# Patient Record
Sex: Female | Born: 1950 | Race: White | Hispanic: No | Marital: Married | Smoking: Never smoker
Health system: Southern US, Community
[De-identification: ages and names within clinical notes are randomized; demographics above are authoritative.]

## PROBLEM LIST (undated history)

## (undated) DIAGNOSIS — F329 Major depressive disorder, single episode, unspecified: Secondary | ICD-10-CM

## (undated) DIAGNOSIS — M1990 Unspecified osteoarthritis, unspecified site: Secondary | ICD-10-CM

## (undated) DIAGNOSIS — I82409 Acute embolism and thrombosis of unspecified deep veins of unspecified lower extremity: Secondary | ICD-10-CM

## (undated) DIAGNOSIS — F32A Depression, unspecified: Secondary | ICD-10-CM

## (undated) DIAGNOSIS — F419 Anxiety disorder, unspecified: Secondary | ICD-10-CM

## (undated) DIAGNOSIS — M419 Scoliosis, unspecified: Secondary | ICD-10-CM

## (undated) DIAGNOSIS — T7840XA Allergy, unspecified, initial encounter: Secondary | ICD-10-CM

## (undated) DIAGNOSIS — M8580 Other specified disorders of bone density and structure, unspecified site: Secondary | ICD-10-CM

## (undated) HISTORY — PX: COLONOSCOPY: SHX174

## (undated) HISTORY — DX: Allergy, unspecified, initial encounter: T78.40XA

## (undated) HISTORY — DX: Major depressive disorder, single episode, unspecified: F32.9

## (undated) HISTORY — DX: Depression, unspecified: F32.A

## (undated) HISTORY — PX: BREAST CYST EXCISION: SHX579

## (undated) HISTORY — DX: Scoliosis, unspecified: M41.9

## (undated) HISTORY — DX: Unspecified osteoarthritis, unspecified site: M19.90

## (undated) HISTORY — DX: Anxiety disorder, unspecified: F41.9

## (undated) HISTORY — DX: Other specified disorders of bone density and structure, unspecified site: M85.80

## (undated) HISTORY — DX: Acute embolism and thrombosis of unspecified deep veins of unspecified lower extremity: I82.409

## (undated) HISTORY — PX: VENA CAVA FILTER PLACEMENT: SUR1032

---

## 1998-08-05 ENCOUNTER — Other Ambulatory Visit: Admission: RE | Admit: 1998-08-05 | Discharge: 1998-08-05 | Payer: Self-pay | Admitting: *Deleted

## 1998-09-13 ENCOUNTER — Ambulatory Visit (HOSPITAL_COMMUNITY): Admission: RE | Admit: 1998-09-13 | Discharge: 1998-09-13 | Payer: Self-pay | Admitting: Obstetrics and Gynecology

## 1999-04-25 HISTORY — PX: CERVICAL DISC SURGERY: SHX588

## 1999-06-29 ENCOUNTER — Encounter: Payer: Self-pay | Admitting: Neurosurgery

## 1999-06-29 ENCOUNTER — Ambulatory Visit (HOSPITAL_COMMUNITY): Admission: RE | Admit: 1999-06-29 | Discharge: 1999-06-29 | Payer: Self-pay | Admitting: Neurosurgery

## 1999-07-21 ENCOUNTER — Encounter: Payer: Self-pay | Admitting: Neurosurgery

## 1999-07-21 ENCOUNTER — Ambulatory Visit (HOSPITAL_COMMUNITY): Admission: RE | Admit: 1999-07-21 | Discharge: 1999-07-21 | Payer: Self-pay

## 1999-08-11 ENCOUNTER — Ambulatory Visit (HOSPITAL_COMMUNITY): Admission: RE | Admit: 1999-08-11 | Discharge: 1999-08-11 | Payer: Self-pay | Admitting: Neurosurgery

## 1999-08-11 ENCOUNTER — Encounter: Payer: Self-pay | Admitting: Neurosurgery

## 1999-11-16 ENCOUNTER — Encounter: Payer: Self-pay | Admitting: Family Medicine

## 1999-11-16 ENCOUNTER — Encounter: Admission: RE | Admit: 1999-11-16 | Discharge: 1999-11-16 | Payer: Self-pay | Admitting: Family Medicine

## 2001-05-20 ENCOUNTER — Other Ambulatory Visit: Admission: RE | Admit: 2001-05-20 | Discharge: 2001-05-20 | Payer: Self-pay | Admitting: Obstetrics and Gynecology

## 2002-10-07 ENCOUNTER — Other Ambulatory Visit: Admission: RE | Admit: 2002-10-07 | Discharge: 2002-10-07 | Payer: Self-pay | Admitting: Obstetrics and Gynecology

## 2003-10-28 ENCOUNTER — Other Ambulatory Visit: Admission: RE | Admit: 2003-10-28 | Discharge: 2003-10-28 | Payer: Self-pay | Admitting: Obstetrics and Gynecology

## 2003-11-24 ENCOUNTER — Encounter (INDEPENDENT_AMBULATORY_CARE_PROVIDER_SITE_OTHER): Payer: Self-pay | Admitting: *Deleted

## 2003-11-24 ENCOUNTER — Ambulatory Visit (HOSPITAL_COMMUNITY): Admission: RE | Admit: 2003-11-24 | Discharge: 2003-11-24 | Payer: Self-pay | Admitting: Internal Medicine

## 2005-02-22 ENCOUNTER — Other Ambulatory Visit: Admission: RE | Admit: 2005-02-22 | Discharge: 2005-02-22 | Payer: Self-pay | Admitting: Obstetrics and Gynecology

## 2006-04-25 ENCOUNTER — Encounter: Admission: RE | Admit: 2006-04-25 | Discharge: 2006-04-25 | Payer: Self-pay | Admitting: Obstetrics and Gynecology

## 2007-01-01 ENCOUNTER — Ambulatory Visit: Payer: Self-pay | Admitting: Oncology

## 2007-01-11 LAB — CBC WITH DIFFERENTIAL/PLATELET
Basophils Absolute: 0 10*3/uL (ref 0.0–0.1)
Eosinophils Absolute: 0.1 10*3/uL (ref 0.0–0.5)
HGB: 13.5 g/dL (ref 11.6–15.9)
MCV: 93.6 fL (ref 81.0–101.0)
MONO#: 0.4 10*3/uL (ref 0.1–0.9)
MONO%: 7.3 % (ref 0.0–13.0)
NEUT#: 2.6 10*3/uL (ref 1.5–6.5)
RBC: 4.04 10*6/uL (ref 3.70–5.32)
RDW: 13.3 % (ref 11.3–14.5)
WBC: 4.9 10*3/uL (ref 3.9–10.0)

## 2007-01-15 LAB — HYPERCOAGULABLE PANEL, COMPREHENSIVE
AntiThromb III Func: 97 % (ref 76–126)
Anticardiolipin IgG: <7 [GPL'U] (ref <11)
Anticardiolipin IgM: <7 [MPL'U] (ref <10)
Beta-2 Glyco I IgG: <4 U/mL (ref <20)
Beta-2-Glycoprotein I IgM: <4 U/mL (ref <10)
Homocysteine: 9.3 umol/L (ref 4.0–15.4)
PTT Lupus Anticoagulant: 39.9 secs (ref 36.3–48.8)
Protein C Activity: 105 % (ref 75–133)
Protein S Ag, Total: 125 % (ref 70–140)

## 2007-01-15 LAB — COMPREHENSIVE METABOLIC PANEL
Albumin: 4 g/dL (ref 3.5–5.2)
BUN: 11 mg/dL (ref 6–23)
CO2: 28 mEq/L (ref 19–32)
Calcium: 9.3 mg/dL (ref 8.4–10.5)
Chloride: 104 mEq/L (ref 96–112)
Glucose, Bld: 82 mg/dL (ref 70–99)
Potassium: 4.1 mEq/L (ref 3.5–5.3)
Sodium: 142 mEq/L (ref 135–145)
Total Protein: 7 g/dL (ref 6.0–8.3)

## 2009-01-29 ENCOUNTER — Encounter: Admission: RE | Admit: 2009-01-29 | Discharge: 2009-01-29 | Payer: Self-pay | Admitting: Neurological Surgery

## 2009-11-02 ENCOUNTER — Encounter: Admission: RE | Admit: 2009-11-02 | Discharge: 2009-11-02 | Payer: Self-pay | Admitting: Family Medicine

## 2009-11-12 ENCOUNTER — Encounter: Admission: RE | Admit: 2009-11-12 | Discharge: 2009-11-12 | Payer: Self-pay | Admitting: Family Medicine

## 2009-11-22 ENCOUNTER — Ambulatory Visit: Payer: Self-pay | Admitting: Oncology

## 2009-11-30 LAB — HYPERCOAGULABLE PANEL, COMPREHENSIVE
Anticardiolipin IgG: 5 GPL U/mL (ref <23)
Anticardiolipin IgM: 2 MPL U/mL (ref <11)
Beta-2-Glycoprotein I IgM: 3 M Units (ref <20)
DRVVT: 38.3 secs (ref 36.2–44.3)
Homocysteine: 8.7 umol/L (ref 4.0–15.4)
Protein C Activity: 128 % (ref 75–133)
Protein C, Total: 114 % (ref 70–140)
Protein S Activity: 76 % (ref 69–129)
Protein S Ag, Total: 105 % (ref 70–140)

## 2010-01-20 ENCOUNTER — Ambulatory Visit: Admission: RE | Admit: 2010-01-20 | Discharge: 2010-01-20 | Payer: Self-pay | Admitting: Oncology

## 2010-01-20 ENCOUNTER — Ambulatory Visit: Payer: Self-pay | Admitting: Vascular Surgery

## 2010-01-20 ENCOUNTER — Encounter: Payer: Self-pay | Admitting: Oncology

## 2010-01-21 ENCOUNTER — Ambulatory Visit: Payer: Self-pay | Admitting: Oncology

## 2010-02-22 ENCOUNTER — Ambulatory Visit: Payer: Self-pay | Admitting: Vascular Surgery

## 2010-04-24 HISTORY — PX: TOTAL KNEE ARTHROPLASTY: SHX125

## 2010-05-09 ENCOUNTER — Ambulatory Visit: Payer: Self-pay | Admitting: Oncology

## 2010-07-05 ENCOUNTER — Ambulatory Visit (HOSPITAL_COMMUNITY)
Admission: RE | Admit: 2010-07-05 | Discharge: 2010-07-05 | Disposition: A | Discharge status: Home / Self Care | Payer: BC Managed Care – PPO | Source: Ambulatory Visit | Attending: Oncology | Admitting: Oncology

## 2010-07-05 DIAGNOSIS — Z7901 Long term (current) use of anticoagulants: Secondary | ICD-10-CM | POA: Insufficient documentation

## 2010-07-05 DIAGNOSIS — I8010 Phlebitis and thrombophlebitis of unspecified femoral vein: Secondary | ICD-10-CM

## 2010-07-05 DIAGNOSIS — I825Y9 Chronic embolism and thrombosis of unspecified deep veins of unspecified proximal lower extremity: Secondary | ICD-10-CM | POA: Insufficient documentation

## 2010-07-05 DIAGNOSIS — Z86718 Personal history of other venous thrombosis and embolism: Secondary | ICD-10-CM

## 2010-07-14 ENCOUNTER — Encounter (HOSPITAL_BASED_OUTPATIENT_CLINIC_OR_DEPARTMENT_OTHER): Payer: BC Managed Care – PPO | Admitting: Oncology

## 2010-07-14 DIAGNOSIS — I824Y9 Acute embolism and thrombosis of unspecified deep veins of unspecified proximal lower extremity: Secondary | ICD-10-CM

## 2010-07-14 DIAGNOSIS — Z7901 Long term (current) use of anticoagulants: Secondary | ICD-10-CM

## 2010-07-14 DIAGNOSIS — D6859 Other primary thrombophilia: Secondary | ICD-10-CM

## 2010-08-25 NOTE — Assessment & Plan Note (Signed)
OFFICE VISIT  Lindsey Wall, Lindsey Wall DOB:  1950/12/02                                       08/24/2010 UEAVW#:[id number]  I discussed patient's case by telephone with Dr. Clelia Croft with Hematology/Oncology, Dr. Thurston Hole, orthopedic, and also with patient.  I had seen her in consultation on 02/22/10 for planning prior to bilateral knee replacements.  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  I had recommended [place] filter placement.  She subsequently saw Dr. Clelia Croft again, and his thinking was to potentially place a retrievable filter before her first knee replacement, remove the filter, and then place another retrievable filter before the second knee replacement, retrieving it afterwards.  His concern was the potential need for lifelong anticoagulation after her filter placement.  I did discuss this with Dr. Clelia Croft.  I explained that I had never heard of the presence of a vena cava filter being an indication for lifelong anticoagulation in the vascular literature.  He states that this was not an absolute indication, but there was some concern in the literature regarding potential emboli from the filter.  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  He felt that she may have an indication for lifelong Coumadin with her Factor V Leiden deficiency and a history of DVT in the past anyway.  After discussing this with Dr. Clelia Croft and Dr. Thurston Hole, we have elected to place a retrieval vena cava filter at the time of her knee replacement with plans of leaving this as a permanent filter with the determination of ongoing anticoagulation per Dr. Clelia Croft.  We will plan her surgery as scheduled on 05/21.    Larina Earthly, M.D. Electronically Signed  TFE/MEDQ  D:  08/24/2010  T:  08/25/2010  Job:  [id number]

## 2010-09-06 NOTE — Consult Note (Signed)
NEW PATIENT CONSULTATION   Lindsey Wall, Lindsey Wall  DOB:  Sep 06, 1950                                       02/22/2010  WGNFA#:[id number]   The patient visits today for evaluation of potential  vena cava filter  replacement prior to the total knee replacement.  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  She is being considered for staged bilateral  knee replacements since she has failed maximum medical therapy.  She  also has a  history of clinical disk surgery.  She has a history of  prior bilateral lower extremity DVTs.  She had undergone a prior  hypercoagulable  workup at the [facility],  Hematology/Oncology by Dr. Clelia Croft.  This revealed a heterozygous factor  V Leiden and otherwise a negative workup..  There is some question as to  whether or not she developed a DVT while on Coumadin and therefore she  has been on once a day  subcutaneous Lovenox injections for 3 months but  the plan continues for a  total of 6 months following the DVT episode in  July.  She does have some mild lower extremity swelling but is really  limited by her knees more than any other factor.  Her  history otherwise  is negative for diabetes or cardiac disease.   SOCIAL HISTORY:  She is married with 2 children.  She does not smoke or  drink alcohol.   FAMILY HISTORY:  Negative premature atherosclerotic disease.   REVIEW OF SYSTEMS:  No weight loss or gain.  She weighs 150 pounds, she  is 5 foot 3 inches tall.  VASCULAR:  Positive for venous clot.  Cardiac, GI, neurologic and pulmonary are negative.  HEMATOLOGIC:  Positive for factor V Leiden deficiency.  GENITOURINARY:  Negative.  ENT is negative.  MUSCULOSKELETAL:  Positive for arthritis and joint pain.  PSYCHIATRIC:  Positive for anxiety.  SKIN:  Negative.   PHYSICAL EXAMINATION:  Well developed, well nourished  white female  appearing her stated age in  no acute distress.  Blood pressure is  123/74, pulse 71,  respirations 14.  HEENT:  Normal.  Chest:  Clear  bilaterally without rales, rhonchi  or wheezes.  Heart:  Regular rate  and rhythm.  Her radial pulses, dorsalis pedis pulse 2+ bilaterally.  Abdomen:  Soft, nontender,  no masses.  Musculoskeletal:  She has no  major deformity.  She does have swelling in both knees.  She has no  cyanosis.  Neurology:  Negative focal neurologic weakness or  paresthesias.  Skin:  Without ulcers or  rashes.   I did review her prior venous duplex done at [facility].   I had a long discussion with this patient explaining that she does not  have any absolute indication for a vena cava filter but she certainly  does have a relative indication.  I explained that she was at  significant increased risk of progressive DVT and pulmonary embolus at  the time of knee surgery.  I discussed both permanent and temporary  retrievable vena cava filters.  I explained that we will be available to  place this at the  time of her knee surgery and this would add very long  time to her surgery.  This could be  done while she was under  general  anesthesia just prior to knee replacement by Dr. Thurston Hole.  We will be  available and would recommend permanent filter placement.  I did explain  the approximately 5% lifelong risk of vena caval occlusion with the  vena caval filter and explained this would usually result after a  massive embolus that would result in severe cardiopulmonary compromise  or death. She understands and wishes to proceed.     Larina Earthly, M.D.  Electronically Signed   TFE/MEDQ  D:  02/22/2010  T:  02/23/2010  Job:  [id number]   cc:   Molly Maduro A. Thurston Hole, M.D.  Blenda Nicely. [employer]

## 2010-09-07 ENCOUNTER — Other Ambulatory Visit (HOSPITAL_COMMUNITY): Payer: Self-pay | Admitting: Orthopedic Surgery

## 2010-09-07 ENCOUNTER — Encounter (HOSPITAL_COMMUNITY)
Admission: RE | Admit: 2010-09-07 | Discharge: 2010-09-07 | Disposition: A | Discharge status: Home / Self Care | Payer: BC Managed Care – PPO | Source: Ambulatory Visit | Attending: Orthopedic Surgery | Admitting: Orthopedic Surgery

## 2010-09-07 DIAGNOSIS — M1990 Unspecified osteoarthritis, unspecified site: Secondary | ICD-10-CM

## 2010-09-07 LAB — URINALYSIS, ROUTINE W REFLEX MICROSCOPIC
Bilirubin Urine: NEGATIVE
Hgb urine dipstick: NEGATIVE
Nitrite: NEGATIVE
Specific Gravity, Urine: 1.008 (ref 1.005–1.030)
Urobilinogen, UA: 0.2 mg/dL (ref 0.0–1.0)

## 2010-09-07 LAB — COMPREHENSIVE METABOLIC PANEL
ALT: 21 U/L (ref 0–35)
Alkaline Phosphatase: 56 U/L (ref 39–117)
CO2: 30 mEq/L (ref 19–32)
Chloride: 105 mEq/L (ref 96–112)
GFR calc Af Amer: >60 mL/min (ref >60)
GFR calc non Af Amer: >60 mL/min (ref >60)
Total Protein: 6.4 g/dL (ref 6.0–8.3)

## 2010-09-07 LAB — PROTIME-INR: INR: 0.94 (ref 0.00–1.49)

## 2010-09-07 LAB — SURGICAL PCR SCREEN
MRSA, PCR: NEGATIVE
Staphylococcus aureus: POSITIVE — AB

## 2010-09-07 LAB — TYPE AND SCREEN: ABO/RH(D): O NEG

## 2010-09-07 LAB — DIFFERENTIAL
Basophils Absolute: 0 10*3/uL (ref 0.0–0.1)
Basophils Relative: 0 % (ref 0–1)
Eosinophils Relative: 1 % (ref 0–5)
Lymphs Abs: 2.6 10*3/uL (ref 0.7–4.0)
Monocytes Relative: 10 % (ref 3–12)
Neutro Abs: 1.7 10*3/uL (ref 1.7–7.7)

## 2010-09-07 LAB — CBC
MCV: 92.9 fL (ref 78.0–100.0)
RDW: 12.8 % (ref 11.5–15.5)
WBC: 4.9 10*3/uL (ref 4.0–10.5)

## 2010-09-07 LAB — ABO/RH: ABO/RH(D): O NEG

## 2010-09-08 LAB — URINE CULTURE
Colony Count: NO GROWTH
Culture  Setup Time: 201205161555
Culture: NO GROWTH

## 2010-09-09 NOTE — Op Note (Signed)
[place]. [facility]  Patient:    Lindsey Wall, Lindsey Wall                          MRN: [id number] Proc. Date: 06/29/99 Adm. Date:  [id number] Disc. Date: [id number] Attending:  Gerald Dexter Dictator:   Reinaldo Meeker, M.D.                           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  PROCEDURE:  C6-7 anterior cervical diskectomy with fibular bone band fusion with operative microscope.  SURGEON:  Dr. Gerlene Fee.  DESCRIPTION OF PROCEDURE:  After being placed in the supine position and 5 pound ______ traction the patients neck was prepped and draped in the usual sterile fashion.  Local x-ray was taken prior to incision to identify the appropriate level.  A transverse incision was made in the right anterior neck, starting at he midline, heading towards the medial aspect of the sternocleidomastoid muscle. he platysmus was then incised transversally.  The natural fascial plane including he strap muscles medially and the sternocleidomastoid laterally was identified and  followed down to the anterior aspect of the cervical spine.  Longus colli muscles were identified, split in the midline, stripped away bilaterally with ______ ______.  Self-retaining retractor was placed for exposure.  Second x-ray showed  appropriate C6-7 level.  Using the 15 blade the annulus of the disc was incised. Using pituitary rongeurs and curettes approximately 90% of the disc material was removed.  A high speed drill was used to widened the disc space.  At this point the microscope was draped, brought into the field, and used throughout the remainder of the case.  Using progressively higher magnification the remainder of the disc material down to the posterior longitudinal ligament was  removed.  This was then removed in a piece fashion.  Disc material was then removed in order to thoroughly decompress the underlying spinal dura.  The deep edges of the C6 and C7 vertebral bodies were then removed as well in order to decompress the underlying spinal dura. Proximal foraminal decompression was carried out as well.  At this point large amounts of irrigation were carried out.  Inspection was then carried out without any evidence of residual compression identified.  Measurements were taken, and mm bone bank plugs were reconstituted.  After irrigated once more and confirming hemostasis the plug was then packed without difficulty.  Final x-ray showed the  plug to be in adequate position, though it was hard to interpret due to the patients body habitus.  This time, irrigation was carried out once more. Bleeding controlled with bipolar coagulation.  The wound was then closed using interrupted Vicryl in the platysmus muscle, inverted 5-0 PDS in the subcuticular layer, and  staples on the skin.  A ______ and Aspen collar were applied.  The patient was extubated and taken to the recovery room in stable condition. DD:  06/29/99 TD:  06/30/99 Job: [id number]

## 2010-09-12 ENCOUNTER — Inpatient Hospital Stay (HOSPITAL_COMMUNITY): Payer: BC Managed Care – PPO

## 2010-09-12 ENCOUNTER — Inpatient Hospital Stay (HOSPITAL_COMMUNITY)
Admission: RE | Admit: 2010-09-12 | Discharge: 2010-09-15 | DRG: 471 | Disposition: A | Discharge status: Rehabilitation Facility | Payer: BC Managed Care – PPO | Source: Ambulatory Visit | Attending: Orthopedic Surgery | Admitting: Orthopedic Surgery

## 2010-09-12 DIAGNOSIS — I824Z9 Acute embolism and thrombosis of unspecified deep veins of unspecified distal lower extremity: Secondary | ICD-10-CM

## 2010-09-12 DIAGNOSIS — Z86718 Personal history of other venous thrombosis and embolism: Secondary | ICD-10-CM

## 2010-09-12 DIAGNOSIS — M5030 Other cervical disc degeneration, unspecified cervical region: Secondary | ICD-10-CM | POA: Diagnosis present

## 2010-09-12 DIAGNOSIS — I2699 Other pulmonary embolism without acute cor pulmonale: Secondary | ICD-10-CM

## 2010-09-12 DIAGNOSIS — D6859 Other primary thrombophilia: Secondary | ICD-10-CM | POA: Diagnosis present

## 2010-09-12 DIAGNOSIS — M1710 Unilateral primary osteoarthritis, unspecified knee: Principal | ICD-10-CM | POA: Diagnosis present

## 2010-09-12 DIAGNOSIS — K5900 Constipation, unspecified: Secondary | ICD-10-CM | POA: Diagnosis not present

## 2010-09-12 DIAGNOSIS — D62 Acute posthemorrhagic anemia: Secondary | ICD-10-CM | POA: Diagnosis not present

## 2010-09-12 DIAGNOSIS — T81718A Complication of other artery following a procedure, not elsewhere classified, initial encounter: Secondary | ICD-10-CM

## 2010-09-12 DIAGNOSIS — G609 Hereditary and idiopathic neuropathy, unspecified: Secondary | ICD-10-CM | POA: Diagnosis present

## 2010-09-13 DIAGNOSIS — Z96659 Presence of unspecified artificial knee joint: Secondary | ICD-10-CM

## 2010-09-13 DIAGNOSIS — M1710 Unilateral primary osteoarthritis, unspecified knee: Secondary | ICD-10-CM

## 2010-09-13 LAB — CBC
MCH: 31.5 pg (ref 26.0–34.0)
MCV: 92.6 fL (ref 78.0–100.0)
RBC: 2.98 MIL/uL — ABNORMAL LOW (ref 3.87–5.11)
RDW: 13 % (ref 11.5–15.5)
WBC: 8.1 10*3/uL (ref 4.0–10.5)

## 2010-09-13 LAB — BASIC METABOLIC PANEL
BUN: 7 mg/dL (ref 6–23)
Calcium: 8.3 mg/dL — ABNORMAL LOW (ref 8.4–10.5)
Creatinine, Ser: 0.5 mg/dL (ref 0.4–1.2)
GFR calc Af Amer: >60 mL/min (ref >60)
GFR calc non Af Amer: >60 mL/min (ref >60)

## 2010-09-13 LAB — HEMOGLOBIN AND HEMATOCRIT, BLOOD: HCT: 26.2 % — ABNORMAL LOW (ref 36.0–46.0)

## 2010-09-13 NOTE — Op Note (Signed)
NAMEFLORENCE, Wall NO.:  [id number]  MEDICAL RECORD NO.:  [id number]           PATIENT TYPE:  I  LOCATION:  5030                         FACILITY:  [facility]  PHYSICIAN:  [name], M.D. DATE OF BIRTH:  May 04, 1950  DATE OF PROCEDURE:  09/12/2010 DATE OF DISCHARGE:                              OPERATIVE REPORT   PREOPERATIVE DIAGNOSES: 1. Left knee degenerative joint disease. 2. Right knee degenerative joint disease.  POSTOPERATIVE DIAGNOSES: 1. Left knee degenerative joint disease. 2. Right knee degenerative joint disease.  PROCEDURE: 1. Left total knee replacement using DePuy cemented total knee system     with #3 cemented femur, #3 cemented tibia with 10-mm polyethylene     RP tibial spacer and 32 mm polyethylene cemented patella. 2. Right total knee replacement using DePuy cemented total knee system     with #3 cemented femur, #3 cemented tibia with 12.5 mm polyethylene     RP tibial spacer and 32 mm polyethylene cemented patella. 3. Zinacef impregnated cement.  SURGEON:  [name], MD  ASSISTANT:  [name], PA-C  ANESTHESIA:  General.  OPERATIVE TIME:  For the left knee 1 hour and 25 minutes, for the right knee 1 hour and 25 minutes.  COMPLICATIONS:  None.  DESCRIPTION OF PROCEDURE:  Ms. Coudriet was brought to the operating room on Sep 12, 2010.  Initially, a left femoral nerve block was placed by Dr. Michelle Piper having anesthesia in the holding room.  A right femoral nerve block was not initially placed because she was to first have a vena cava IVC filter placed by Dr. Arbie Cookey of Vascular surgery due to her history of DVTs and pulmonary emboli.  After being placed under general anesthesia, Dr. Arbie Cookey proceeded with placement of this IVC filter and he will dictate this procedure separately.  After he was done with this procedure which took approximately 20 minutes, both knees were examined under anesthesia.  She had range of motion  from minus 5-125 degrees bilaterally.  Knees were stable to ligamentous exam with mild varus deformity and normal patellar tracking.  She had a Foley catheter placed under sterile conditions.  She received Ancef 2 g IV preoperatively for prophylaxis.  Both legs were prepped using sterile DuraPrep and draped using sterile technique.  Time-out procedure was called and both legs identified as the correct legs.  Initially, the left knee was addressed. Surgically, the left leg was exsanguinated and a thigh tourniquet elevated at 365 mm.  Initially through a 15-cm longitudinal incision based over the patella, initial exposure was made.  The underlying subcutaneous tissues were incised along with skin incision.  A median arthrotomy was performed revealing an excessive amount of normal- appearing joint fluid.  The articular surfaces were inspected.  She had grade 4 changes medially, laterally and in the patellofemoral joints. Osteophytes removed from the femoral condyles and tibial plateau.  The medial and lateral meniscal remnants were removed as well as the anterior cruciate ligament.  Intramedullary drill was then drilled up the femoral canal for placement of distal femoral cutting jig which was placed in  the appropriate manner of rotation and a distal 10-mm cut was made based off the medial side.  At this point, the distal femur was sized.  #3 was found to be appropriate size.  #3 cutting jig was placed in an appropriate manner of external rotation and these cuts were made. Proximal tibia was then exposed and tibial spines were removed with an oscillating saw.  Intramedullary drill was drilled down the tibial canal for placement of the proximal tibial cutting jig which was placed in the appropriate manner of rotation and a proximal 4-mm cut was made based off the medial or lower side.  Spacer blocks were then placed in flexion and extension.  10-mm blocks gave excellent balancing,  excellent stability, and excellent correction of her flexion and varus deformities.  #3 tibial baseplate trial was placed on the cut tibial surface with an excellent fit and a keel cut was made.  The PCL box cutter was placed on the distal femur and these cuts were made.  At this point with a #3 tibial baseplate trial in place, a #3 femoral trial in place and a 10-mm polyethylene RP tibial spacer was placed the knee reduced, taken through range of motion from 0-125 degrees with excellent stability and excellent correction of her flexion and varus deformities and normal patellar tracking.  A resurfacing 8.5-mm cut was made on the patella and three locking holes placed for a 32-mm patellar trial and again patellofemoral tracking was evaluated and found to be normal.  At this point, it was felt that all the trial components were of excellent size, fit and stability.  They were then removed.  The knee was then jet lavage irrigated with 3 liters of saline.  The proximal tibia was then exposed and #3 tibial baseplate with Zinacef impregnated cement backing was hammered into position with an excellent fit with excess cement being removed from around the edges.  #3 femoral component with cement backing was hammered into position also with an excellent fit with excess cement being removed from around the edges.  A 10-mm polyethylene RP tibial spacer was placed on tibial baseplate.  The knee reduced, taken through range of motion from 0-125 degrees with excellent stability and excellent correction of her flexion and varus deformities. A 32-mm polyethylene cement backed patella was then placed in its position and held there with a clamp.  After the cement hardened again patellofemoral tracking was evaluated and found to be normal.  At this point, it was felt that all of the components were of excellent size, fit and stability.  The wound was further irrigated with saline. Tourniquet was released.   Hemostasis obtained with cautery.  The arthrotomy was then closed with #1 Ethibond suture over two medium Hemovac drains.  Subcutaneous tissues closed with 0 and 2-0 Vicryl, subcuticular layer closed with 4-0 Monocryl.  Sterile dressings were applied.  At this point, the patient was doing well from anesthetic standpoint and we proceeded with the right knee.  The right leg was exsanguinated and thigh tourniquet elevated at 365 mm. Initially through a 15-cm longitudinal incision based over the patella, initial exposure was made.  The underlying subcutaneous tissues were incised along with skin incision.  A median arthrotomy was performed revealing an excessive amount of normal-appearing joint fluid.  The articular surfaces were inspected.  She had grade 4 changes medially, laterally and in the patellofemoral joint.  Osteophytes removed from the femoral condyles and tibial plateau.  The medial and lateral meniscal remnants were  removed as well as the anterior cruciate ligament. Intramedullary drill was then drilled up the femoral canal for placement of distal femoral cutting jig which was placed in the appropriate manner of rotation and a distal 10-mm cut was made.  The distal femur was incised.  #3 was found to be the appropriate size.  #3 cutting jig was placed in the appropriate manner of external rotation and then these cuts were made.  The proximal tibia was then exposed.  The tibial spines were removed with an oscillating saw.  Intramedullary drill was drilled down the tibial canal for placement of proximal tibial cutting jig which was placed in appropriate manner of rotation.  A proximal 4-mm cut was made based off the medial or lower side.  Spacer blocks were then placed in flexion and extension.  12.5-mm blocks gave excellent balancing, excellent stability, and excellent correction of her flexion and varus deformities.  A #3 tibial baseplate trial was placed on the cut  tibial surface with an excellent fit and the keel cut was made.  The PCL box cutter was placed on the distal femur and these cuts were made.  At this point, the #3 femoral trial was placed with a #3 tibial baseplate trial and a 12.5-mm polyethylene RP tibial spacer, knee was reduced, taken through range of motion from 0-125 degrees with excellent stability and excellent correction of her flexion and varus deformities and normal patellar tracking.  A resurfacing 8.5-mm cut was made on the patella and three locking holes placed for a 32-mm patellar trial.  After this was placed again patellofemoral tracking was evaluated and found to be normal.  At this point, it was felt that all the trial components were of excellent size, fit and stability.  They were then removed.  The knee was then jet lavage irrigated with 3 liters of saline.  Proximal tibia was then exposed and #3 tibial baseplate with Zinacef impregnated cement backing was hammered into position with an excellent fit with excess cement being removed from around the edges.  #3 femoral component with cement backing was hammered into position also with an excellent fit with excess cement being removed from around the edges.  A 12.5-mm polyethylene RP tibial spacer was placed on tibial baseplate, knee reduced, taken through range of motion from 0-125 degrees with excellent stability and excellent correction of her flexion and varus deformities. The 32-mm polyethylene cement backed patella was then placed in its position and held there with a clamp.  After the cement hardened, again patellofemoral tracking was evaluated and found to be normal.  At this point, it was felt that all the components were of excellent size, fit and stability.  The wound was further irrigated with saline.  Tourniquet was released.  Hemostasis obtained with cautery.  The arthrotomy was then closed with #1 Ethibond suture over two medium Hemovac  drains. Subcutaneous tissues were closed with 0 and 2-0 Vicryl, subcuticular layer closed with 4-0 Monocryl.  Sterile dressings and long-leg splint applied.  The patient then awakened, extubated, and taken to recovery room in stable condition.  Needle and sponge counts correct x2 at the end of the case.  Neurovascular status normal.  Pulses 2+ and symmetric.     Kealie Barrie A. Thurston Hole, M.D.     RAW/MEDQ  D:  09/12/2010  T:  09/12/2010  Job:  [id number]  Electronically Signed by Salvatore Marvel M.D. on 09/13/2010 05:29:33 PM

## 2010-09-14 LAB — BASIC METABOLIC PANEL
Calcium: 8.4 mg/dL (ref 8.4–10.5)
Chloride: 108 mEq/L (ref 96–112)
Creatinine, Ser: <0.47 mg/dL (ref 0.4–1.2)
Sodium: 141 mEq/L (ref 135–145)

## 2010-09-14 LAB — CBC
MCH: 31.2 pg (ref 26.0–34.0)
Platelets: 177 10*3/uL (ref 150–400)
RBC: 2.82 MIL/uL — ABNORMAL LOW (ref 3.87–5.11)
WBC: 8 10*3/uL (ref 4.0–10.5)

## 2010-09-14 LAB — HEMOGLOBIN AND HEMATOCRIT, BLOOD: Hemoglobin: 9 g/dL — ABNORMAL LOW (ref 12.0–15.0)

## 2010-09-15 ENCOUNTER — Inpatient Hospital Stay (HOSPITAL_COMMUNITY)
Admission: RE | Admit: 2010-09-15 | Discharge: 2010-09-22 | DRG: 462 | Disposition: A | Discharge status: Home / Self Care | Payer: BC Managed Care – PPO | Source: Other Acute Inpatient Hospital | Attending: Physical Medicine & Rehabilitation | Admitting: Physical Medicine & Rehabilitation

## 2010-09-15 DIAGNOSIS — M4712 Other spondylosis with myelopathy, cervical region: Secondary | ICD-10-CM

## 2010-09-15 DIAGNOSIS — Z96659 Presence of unspecified artificial knee joint: Secondary | ICD-10-CM

## 2010-09-15 DIAGNOSIS — M1710 Unilateral primary osteoarthritis, unspecified knee: Secondary | ICD-10-CM

## 2010-09-15 DIAGNOSIS — D6859 Other primary thrombophilia: Secondary | ICD-10-CM | POA: Diagnosis present

## 2010-09-15 DIAGNOSIS — Z5189 Encounter for other specified aftercare: Principal | ICD-10-CM

## 2010-09-15 DIAGNOSIS — Z981 Arthrodesis status: Secondary | ICD-10-CM

## 2010-09-15 DIAGNOSIS — Z86718 Personal history of other venous thrombosis and embolism: Secondary | ICD-10-CM

## 2010-09-15 DIAGNOSIS — D62 Acute posthemorrhagic anemia: Secondary | ICD-10-CM | POA: Diagnosis present

## 2010-09-15 DIAGNOSIS — L259 Unspecified contact dermatitis, unspecified cause: Secondary | ICD-10-CM | POA: Diagnosis not present

## 2010-09-15 DIAGNOSIS — I959 Hypotension, unspecified: Secondary | ICD-10-CM | POA: Diagnosis not present

## 2010-09-15 DIAGNOSIS — K5900 Constipation, unspecified: Secondary | ICD-10-CM | POA: Diagnosis present

## 2010-09-15 LAB — CBC
Hemoglobin: 8.4 g/dL — ABNORMAL LOW (ref 12.0–15.0)
MCH: 31.5 pg (ref 26.0–34.0)
MCV: 93.6 fL (ref 78.0–100.0)
RBC: 2.67 MIL/uL — ABNORMAL LOW (ref 3.87–5.11)

## 2010-09-15 LAB — BASIC METABOLIC PANEL
CO2: 30 mEq/L (ref 19–32)
Chloride: 103 mEq/L (ref 96–112)
Creatinine, Ser: 0.5 mg/dL (ref 0.4–1.2)
GFR calc Af Amer: >60 mL/min (ref >60)

## 2010-09-16 DIAGNOSIS — Z96659 Presence of unspecified artificial knee joint: Secondary | ICD-10-CM

## 2010-09-16 DIAGNOSIS — M1710 Unilateral primary osteoarthritis, unspecified knee: Secondary | ICD-10-CM

## 2010-09-16 DIAGNOSIS — M4712 Other spondylosis with myelopathy, cervical region: Secondary | ICD-10-CM

## 2010-09-16 LAB — URINALYSIS, ROUTINE W REFLEX MICROSCOPIC
Nitrite: NEGATIVE
Specific Gravity, Urine: 1.012 (ref 1.005–1.030)
Urobilinogen, UA: 0.2 mg/dL (ref 0.0–1.0)
pH: 8 (ref 5.0–8.0)

## 2010-09-16 LAB — CBC
HCT: 24.8 % — ABNORMAL LOW (ref 36.0–46.0)
Hemoglobin: 8.4 g/dL — ABNORMAL LOW (ref 12.0–15.0)
MCH: 31.7 pg (ref 26.0–34.0)
MCHC: 33.9 g/dL (ref 30.0–36.0)
MCV: 93.6 fL (ref 78.0–100.0)

## 2010-09-16 LAB — COMPREHENSIVE METABOLIC PANEL
BUN: 7 mg/dL (ref 6–23)
CO2: 29 mEq/L (ref 19–32)
Calcium: 8.3 mg/dL — ABNORMAL LOW (ref 8.4–10.5)
Creatinine, Ser: <0.47 mg/dL (ref 0.4–1.2)
Glucose, Bld: 104 mg/dL — ABNORMAL HIGH (ref 70–99)

## 2010-09-16 LAB — DIFFERENTIAL
Basophils Relative: 0 % (ref 0–1)
Lymphocytes Relative: 28 % (ref 12–46)
Monocytes Absolute: 0.7 10*3/uL (ref 0.1–1.0)
Monocytes Relative: 10 % (ref 3–12)
Neutro Abs: 4.7 10*3/uL (ref 1.7–7.7)

## 2010-09-17 LAB — IRON AND TIBC
Iron: 34 ug/dL — ABNORMAL LOW (ref 42–135)
Saturation Ratios: 16 % — ABNORMAL LOW (ref 20–55)
UIBC: 177 ug/dL

## 2010-09-17 LAB — URINE CULTURE: Culture  Setup Time: 201205250927

## 2010-09-19 LAB — CBC
HCT: 23.8 % — ABNORMAL LOW (ref 36.0–46.0)
Hemoglobin: 8.2 g/dL — ABNORMAL LOW (ref 12.0–15.0)
RDW: 13.1 % (ref 11.5–15.5)
WBC: 7 10*3/uL (ref 4.0–10.5)

## 2010-09-20 DIAGNOSIS — Z96659 Presence of unspecified artificial knee joint: Secondary | ICD-10-CM

## 2010-09-20 DIAGNOSIS — M4712 Other spondylosis with myelopathy, cervical region: Secondary | ICD-10-CM

## 2010-09-20 DIAGNOSIS — M1710 Unilateral primary osteoarthritis, unspecified knee: Secondary | ICD-10-CM

## 2010-09-21 NOTE — H&P (Signed)
NAMEVILLA, BURGIN NO.:  [id number]  MEDICAL RECORD NO.:  [id number]           PATIENT TYPE:  I  LOCATION:  4148                         FACILITY:  [facility]  PHYSICIAN:  Erick Colace, M.D.DATE OF BIRTH:  1950/11/08  DATE OF ADMISSION:  09/15/2010 DATE OF DISCHARGE:                             HISTORY & PHYSICAL   REASON FOR ADMISSION:  Bilateral total knee replacement rehabilitation.  HISTORY:  A 60 year old female with prior history of factor V Leiden deficiency with history of bilateral DVTs, end-stage DJD bilateral knees admitted Sep 12, 2010, for IVC filter placement prior to bilateral TKR per Dr. Salvatore Marvel.  Postoperatively we made weightbearing, tolerated CPM, full range of motion.  The patient continues on subcu Lovenox with anticoagulation.  Further changes in anticoagulation therapy to be decided by Hematology/Oncology.  Has had issues with hypotension treated with IV fluids.  Continues to have difficulty with transition of movements.  Left knee 0-40 degrees, right knee is 0-30 degrees limited range of motion secondary to pain.  REVIEW OF SYSTEMS:  RESPIRATORY:  Negative.  GI:  Negative.  GU: Negative.  MUSCULOSKELETAL:  As above.  Review of systems otherwise negative.  PAST HISTORY:  Bilateral lower extremity DVT, heterozygous for factor V Leiden deficiency.  She has undergone a C6-7 ACDF in 2001.  Has had a C3- C6 fusion in the past cord flattening.  Depression, scoliosis also in the past history.  FAMILY HISTORY:  Healthy except daughter does have factor V Leiden deficiency, had history of pulmonary emboli on birth control pills.  SOCIAL HISTORY:  Married.  Lives in 1-level home with 6 steps to enter. No tobacco, EtOH is rare.  Husband works days, daughter has to stay with the patient post discharge.  FUNCTIONAL HISTORY:  Independent prior to admission.  MEDICATIONS: 1. Lexapro 20 mg a day. 2. Neurontin 300 mg two p.o.  t.i.d. 3. Mag oxide 400 mg daily. 4. Baclofen 20 mg t.i.d. 5. Caltrate with D one p.o. b.i.d. 6. Lovenox 100 mg subcu daily at home.  LABORATORY DATA:  H and H Sep 16, 2010, 8.4 and 25, BUN 8, creatinine 0.5.  Sodium 138, potassium 3.7.  PHYSICAL EXAMINATION:  VITAL SIGNS:  Blood pressure 110/59, pulse 91, temperature 99.4. GENERAL:  No acute distress. HEENT:  Eyes anicteric and noninjected.  External ENT normal. NECK:  Supple without adenopathy. RESPIRATORY:  Effort is good.  Lungs are clear. HEART:  Regular rate and rhythm.  No rubs, murmurs or extra sounds. ABDOMEN:  Positive bowel sounds, soft, nontender to palpation. EXTREMITIES:  No clubbing, cyanosis or edema.  No calf tenderness.  She has bilateral anterior knee incisions. PSYCH:  Mood, memory, affect, judgment, orientation are all intact. NEURO:  Motor strength is 5/5 bilateral deltoid, biceps, triceps, grip, 3- bilateral hip flexors, knee extension is not tested secondary to pain.  She has 4 at the ankle dorsiflexors.  Gait shows no evidence of toe drag or knee instability.  She walks very very slowly, widened base support.  Decreased knee flexion with a walker and min assist x1.  POST ADMISSION PHYSICIAN  EVALUATION: 1. Functional deficits secondary to knee osteoarthritis status post     bilateral total knee replacement. 2. The patient admitted to receive collaborative interdisciplinary     care between the physiatrist, rehab nursing staff and therapy team. 3. The patient's level of medical complexity and substantial therapy     needs in context of that medical necessity cannot be provided at a     lesser intensity of care. 4. The patient has experienced substantial functional loss from her     baseline.  Upon functional assessment at the time of preadmission     screening, was pending in terms of her evaluations.  Currently, the     patient is mod assist with bed mobility, max assist at edge of bed     transfers,  min assist 25 feet with ambulation, mod assist to total     for lower body dressing, set up for upper body, total transfers are     mod assist.  Judging by the patient's diagnosis, physical exam and     functional history, the patient has potential for functional     progress which will result in measurable gains while in inpatient     rehab.  These gains will be of substantial and practical use upon     discharge to home in facilitating mobility, self-care and     independence.  Interim changes in medical status since preadmission     screening are detailed in history of present illness. 5. Physiatrist will provide 24-hour management of medical needs as     well as oversight of therapy plan/treatment via guidance as     appropriate regarding interactions of the two. 6. A 24-hour rehab nursing will assess in management of skin, bowel,     bladder and help integrate therapy concepts, techniques, and     education. 7. PT will assess and treat for pre gait training, gait training,     endurance, equipment.  Goals are for a modified independent level     to supervision for mobility. 8. OT will assess and treat for ADLs, safety, endurance, equipment.     Goals will be modified independent level for upper body and min     assist lower body ADLs. 9. Case management and social worker will assess and treat for     psychosocial issues and discharge planning. 10.A team conference will be held weekly to assess the patient's     progress/goals and to determine barriers to discharge. 11.The patient has demonstrated sufficient medical stability and     exercise capacity to tolerate at least 3 hours of therapy per day     at least 5 days per week. 12.Estimated length of stay is 10 days.  Prognosis for further functional improvement is good.  MEDICAL PROBLEM LIST AND PLAN: 1. DVT prophylaxis.  We will continue subcu Lovenox full dose. 2. Pain management schedule fentanyl patch.  Continue  Percocet. 3. Hypotension.  IV fluids discontinued today, push p.o. fluids and     monitor. 4. Cervical myelopathy.  Continue Neurontin, baclofen. 5. Constipation.  Colace changed to MiraLax.  May need to reinstitute     Colace as well.  Motivation and mood appear to be adequate for inpatient rehabilitation for the patient.  Inpatient Rehab Medicine Services discussed with the patient, answered questions, also talked with this with daughter in the room in the patient's presence.     Erick Colace, M.D.     AEK/MEDQ  D:  09/15/2010  T:  09/16/2010  Job:  [id number]  cc:   Benjiman Core, M.D. Robert A. Thurston Hole, M.D. Carola J. Gerri Spore, M.D.  Electronically Signed by Claudette Laws M.D. on 09/21/2010 09:31:12 AM

## 2010-09-29 NOTE — Discharge Summary (Signed)
Lindsey Wall, KATO NO.:  [id number]  MEDICAL RECORD NO.:  [id number]           PATIENT TYPE:  I  LOCATION:  4148                         FACILITY:  [facility]  PHYSICIAN:  Erick Colace, M.D.DATE OF BIRTH:  Oct 26, 1950  DATE OF ADMISSION:  09/15/2010 DATE OF DISCHARGE:  09/22/2010                              DISCHARGE SUMMARY   DISCHARGE DIAGNOSES: 1. Bilateral total knee replacement. 2. Acute blood loss anemia. 3. Factor V Leiden deficiency. 4. Cervical myelopathy.  HISTORY OF PRESENT ILLNESS:  Ms. Lindsey Wall is a 60 year old female with prior history of Factor V Leiden deficiency with bilateral DVTs, end- stage DJD bilateral knees who was admitted Sep 12, 2010, for IVC filter placement prior to bilateral total knee replacement by Dr. Salvatore Marvel.  Postop was made.  Weightbearing as tolerated with CPM use for passive range of motion.  She currently continues on her home dose of Lovenox.  Further changes in anticoagulation to be decided by Hem/Onc past discharge.  The patient has had issues with hypertension treated with IV fluids.  She continues to have difficulty with transitional movements.  Left knee range of motion is at 0-40 degrees, right knee at 0-30 degrees with limited range of motion due to pain issues.  The patient was evaluated by rehab and we felt that she would benefit from CIR program.  PAST MEDICAL HISTORY:  Significant for bilateral lower extremity DVTs, heterozygous Factor V Leiden deficiency, C6-C7 ACDF in 2001, history of C3-C4-C6 gliosis and cord flattening with chronic pain, depressions and lumbar scoliosis.  ALLERGIES:  E-MYCIN and NSAIDS.  REVIEW OF SYMPTOMS:  Positive for pain and constipation issues.  FAMILY HISTORY:  The patient reports immediate family is healthy. Daughter does have Factor V Leiden deficiency.  SOCIAL HISTORY:  The patient is married, lives in 1-level home with 6 steps at entry.  Does not use any  tobacco, uses alcohol rarely.  Husband works days.  However, daughter plans to assist past discharge.  FUNCTIONAL HISTORY:  The patient was independent prior to admission.  FUNCTIONAL STATUS:  The patient is mod assist for bed mobility and transfers, min assist for ambulate 50 feet with rolling walker, setup assist for upper body care, mod-to-total assist for lower body care.  PHYSICAL EXAMINATION:  VITALS:  Blood pressure 110/59, pulse 91, temperature 99.4. GENERAL:  The patient is well-nourished, well-developed female in no acute distress. HEENT:  Eyes anicteric, noninjected.  Oral mucosa is pink and moist. Nares patent.  Teeth in good repair. LUNGS:  Clear to auscultation bilaterally without wheezes, rales or rhonchi.  Respiratory effort is good. HEART:  Regular rate and rhythm without murmurs, gallops or rubs. ABDOMEN:  Soft, nontender with positive bowel sounds. EXTREMITIES:  No clubbing, cyanosis or edema except minimal edema at bilateral knee incisions.  No calf tenderness. NEUROLOGIC:  The patient is alert and oriented x3.  Motor strength is 5/5 bilateral deltoids, biceps, triceps grip, 3- bilateral hip flexions, knee extensions not tested due to pain.  She has 4/5 strength at ankle plantar dorsiflexion.  Gait without evidence of toe drag or knee  instability.  She tends to walk slowly with wide base of support.  HOSPITAL COURSE:  Ms. Lindsey Wall was admitted to rehab on Sep 15, 2010, for inpatient therapies to consist of PT/OT at least 3 hours 5 days a week.  Past admission physiatrist rehab RN and therapy team have worked together to provide customized collaborative interdisciplinary care. Rehab RN has worked with the patient on bowel and bladder program as well as pain management issues.  The patient's knee incision had been monitored long and these are noted to be healing well without any signs or symptoms of infection.  The patient's blood pressures have been checked on  b.i.d. basis and these are ranging from high 90s-120s systolic, 50s-60s diastolic.  Heart rate stable in 60s-70s range.  Labs were done on past admission for followup showing the patient with acute blood loss anemia with H and H at 8.4 and 25.0.  Recheck labs of Sep 19, 2010 shows hemoglobin 8.2, hematocrit 23.8, white count 7.0, platelets 304.  Iron studies done reveal low iron at 34, TIBC at 211, percent saturation at 16.  The patient was started on iron supplement, however, she was unable to tolerate this due to nausea, therefore this was discontinued.  A UA/UC was done past admission and this was negative. Check of lytes past admission revealed sodium 139, potassium 3.8, chloride 105, CO2 29, BUN 7, creatinine 0.47, glucose 104.  LFTs showed low protein stores with albumin at 2.5, total protein 5.4, calcium at 8.3.  The patient was maintained on subcu Lovenox throughout her stay. She was noted to have poor pain management at time of admission and fentanyl patch 12 at mcg was initiated at admission.  This was increased to 25 mcg per day with improvement in her pain symptomatology.  The patient to be discontinued on this with slow taper over the next 2 weeks.  The patient has also had issues with constipation.  She was started on bowel program with addition of Senokot and MiraLax.  She is advised to continue on these meds while on narcotics.  During the patient's stay in rehab, weekly team conference was held to monitor the patient's progress, set goals as well as discuss barriers to discharge.  At time of admission, the patient was limited by decreased endurance, bilateral lower extremity weakness as well as pain control issues.  She required steady to min assist for most of her ADL tasks. OT has worked with the patient on transfers as well as worked on teaching the patient how to compensate and strategies to correct body mechanics for toilet transfers.  Currently, the patient has  advanced to being at modified independent level for all bathing and dressing tasks.  She does not require further OT  past discharge.  Physical Therapy has worked with the patient on mobility.  At admission, the patient was mod assist for sit to stand transfers, min assist for ambulating greater than 150 feet with a rolling walker.  Physical Therapy has been working with the patient's mobility as well as range of motion of bilateral knees.  Currently, the patient is modified independent for all transfers, modified independent for ambulating greater than 500 feet.  She is able to navigate stairs without difficulty.  Family education has been done with the patient's husband.  The patient has elected on further outpatient physical therapy, which will continue past discharge.  On Sep 22, 2010, the patient is discharged to home.  DISCHARGE MEDICATIONS: 1. Fentanyl patch 12 mcg per  hour change q. 72 h #3 RX. 2. Fentanyl patch 25 mcg per hour change q.72 h #3 RX. Use 25 mcg patch      first.  Once this is used up, taper to 12 mcg of patch. 3. Robaxin 500 mg p.o. q.i.d. use as needed for spasms. 4. Zofran 4 mg p.o. q.8 h p.r.n. nausea. 5. OxyIR 5 mg 1-2 p.o. q.4 h p.r.n. moderate-to-severe pain #90 RX. 6. MiraLax 17 g and 8 ounces p.o. per day. 7. Senokot-S 2 p.o. at lunch and 2 p.o. nightly. 8. Triamcinolone cream topically as needed. 9. Baclofen 10 mg 2 tablets p.o. t.i.d. 10.Os-Cal plus D 2 p.o. b.i.d. 11.Neurontin 600 mg p.o. t.i.d. 12.Lexapro 20 mg per day. 13.Lovenox 100 mg subcu q.a.m. 14.Mag ox 400 mg a day. 15.Super B complex 1 p.o. per day.  Diet: Regular.  Activity level:  Intermittent supervision.  No strenuous activity.  No driving till cleared by MD.  Outpatient physical therapy at [facility].  FOLLOWUP:  The patient to follow up with Dr. Wynn Banker as needed. Follow up with Dr. Thurston Hole on September 26, 2010.  Follow up with Dr. Clelia Croft for routine check and for input  regarding anticoagulation.  Follow up with Dr. Gerri Spore for routine check in two weeks including follow u on ABLA.     Delle Reining, P.A.   ______________________________ Erick Colace, M.D.    PL/MEDQ  D:  09/22/2010  T:  09/22/2010  Job:  [id number]  cc:   Otilio Connors. Gerri Spore, M.D. Benjiman Core, M.D. Robert A. Thurston Hole, M.D.  Electronically Signed by Osvaldo Shipper. on 09/22/2010 04:21:46 PM Electronically Signed by Claudette Laws M.D. on 09/29/2010 11:50:05 AM

## 2010-10-11 NOTE — Op Note (Signed)
  [name], [name] NO.:  [id number]  MEDICAL RECORD NO.:  [id number]           PATIENT TYPE:  I  LOCATION:  5030                         FACILITY:  [facility]  PHYSICIAN:  Larina Earthly, M.D.    DATE OF BIRTH:  February 14, 1951  DATE OF PROCEDURE:  09/12/2010 DATE OF DISCHARGE:                              OPERATIVE REPORT   PREOPERATIVE DIAGNOSIS:  Hypercoagulability with history of deep vein thrombosis for bilateral total knee replacements.  POSTOPERATIVE DIAGNOSIS:  Hypercoagulability with history of deep vein thrombosis for bilateral total knee replacements.  PROCEDURE:  Prophylactic inferior vena cava filter placement.  SURGEON:  Larina Earthly, MD  ASSISTANT:  Nurse.  ANESTHESIA:  General endotracheal.  COMPLICATIONS:  None.  Bilateral total knee replacement by Dr. Thurston Hole will be dictated as a separate note.  PROCEDURE IN DETAIL:  The patient was taken to the operating room and placed in supine position where the area of the right groin was prepped and draped in usual sterile fashion.  Using ultrasound visualization and a single-wall puncture, the right common femoral vein was entered and a guidewire was passed up to the level of the inferior vena cava and  this was confirmed with fluoroscopy.  The eclipse of vena cava filter was brought onto the field and the 7-French long delivery system was positioned at the level of L2.  Hand contrast injection showed that the cable was of normal size and there was a wisp of contrast at the renal veins.  The dilator was removed and the femoral access filter was placed in through the sheath.  The sheath was positioned at the level of the deployment.  The filter was deployed with the distal tip of the filter at mid L2, the filter was deployed normally.  The sheath was removed and pressure was held for hemostasis.  The patient did not have any immediate complication and was then to undergo bilateral total  knee replacement by Dr. Thurston Hole.  The patient will receive KUB x-ray in the recovery room for placement.     Larina Earthly, M.D.     TFE/MEDQ  D:  09/12/2010  T:  09/13/2010  Job:  [id number]  cc:   Benjiman Core, M.D. Robert A. Thurston Hole, M.D.  Electronically Signed by Zriyah Kopplin M.D. on 10/11/2010 01:10:36 PM

## 2010-11-02 NOTE — Discharge Summary (Signed)
Lindsey Wall, OSTROM NO.:  [id number]  MEDICAL RECORD NO.:  [id number]           PATIENT TYPE:  I  LOCATION:  5030                         FACILITY:  [facility]  PHYSICIAN:  [name], M.D. DATE OF BIRTH:  [birth year]  DATE OF ADMISSION:  09/12/2010 DATE OF DISCHARGE:  09/15/2010                        DISCHARGE SUMMARY - REFERRING   ADMITTING DIAGNOSES: 1. Bilateral end-stage degenerative joint disease. 2. Factor V Leiden deficiency. 3. History of multiple deep vein thromboses with one possibly     developing on Coumadin. 4. Chronic Lovenox therapy. 5. Neuropathy. 6. Cervical degenerative disk disease with cervical stenosis.  DISCHARGE DIAGNOSES: 1. Bilateral knee end-stage degenerative joint disease, status post     total knee replacement. 2. Acute blood loss anemia. 3. Factor V Leiden deficiency. 4. Cervical degenerative disk disease with spinal stenosis. 5. Peripheral neuropathy. 6. Constipation.  HISTORY OF PRESENT ILLNESS:  The patient is a 60 year old white female with history of bilateral knee end-stage DJD.  Due to her history of multiple DVTs and factor V Leiden deficiency, she had an IVC filter placed by Dr. Arbie Cookey preoperatively on the day of surgery.  PROCEDURES IN-HOUSE:  Inferior vena cava filter by Dr. Arbie Cookey, bilateral total knee replacement by Dr. Thurston Hole, bilateral femoral nerve block by Anesthesia, and Autovac transfusion.  She tolerated all of these procedures well.  HOSPITAL COURSE:  The patient was admitted postoperatively for pain control, DVT prophylaxis, and physical therapy.  On postop day #1, the patient had difficulty with therapy in the morning due to hypotension, but in the afternoon was very cooperative.  Rehab consult was ordered because of her bilateral total knees and that she was an excellent rehab candidate.  She was given fluid boluses on postop day #1 due to her hypotension and on postop day #2 the patient's  hemoglobin was 8.8.  H and H was ordered in the evening in case she needed blood transfusion. That evening, the patient maintained her hemoglobin at 8.8.  That day, she was able to walk 50 feet.  On postop day #3, the patient is alert and oriented.  Her hemoglobin is 8.4.  She is running a slight low-grade fever at 99.4.  Surgical wounds were well approximated and healing. Blood pressure is stable at 110/59.  Pulse is stable at 91.  Surgical wounds were well approximated.  She has no erythema, no ecchymosis. Moderate effusions bilaterally.  She has 2+ dorsalis pedis pulses.  She is being discharged to Rehab in stable condition.  Weightbearing as tolerated.  She will need her CPM 0-70 degrees for 6 hours a day, increased by 5 degrees a day.  She will need to elevate both heels on yellow foam blocks 2-3 times a day to work on extension.  She will need daily physical therapy, daily occupational therapy and group therapy.  DISCHARGE MEDICATIONS: 1. Oxycodone/APAP 5/325 one to two q.4 h. p.r.n. pain. 2. Baclofen 2 tablets p.o. 3 times a day. 3. Calcium citrate with vitamin D 2 tablets daily. 4. Gabapentin 300 mg 2 capsules 3 times a day. 5. Lexapro 20 mg 1 tablet daily.  6. Lovenox 100 mg 1 injection daily. 7. Mag oxide 1 tablet daily. 8. Super B-complex 1 tablet daily.  She needs to follow up with Dr. Thurston Hole on September 26, 2010, for stitch removal.  She is being transferred in stable condition, weightbearing as tolerated, on a regular diet to Rehab at [facility].     [name], PA-C   ______________________________ [name], M.D.    KS/MEDQ  D:  09/15/2010  T:  09/15/2010  Job:  [id number]  Electronically Signed by [name] P.A. on 10/23/2010 07:09:55 PM Electronically Signed by Salvatore Marvel M.D. on 11/02/2010 11:25:48 AM

## 2010-11-04 ENCOUNTER — Encounter (HOSPITAL_BASED_OUTPATIENT_CLINIC_OR_DEPARTMENT_OTHER): Payer: BC Managed Care – PPO | Admitting: Oncology

## 2010-11-04 DIAGNOSIS — Z7901 Long term (current) use of anticoagulants: Secondary | ICD-10-CM

## 2010-11-04 DIAGNOSIS — D6859 Other primary thrombophilia: Secondary | ICD-10-CM

## 2010-11-04 DIAGNOSIS — I824Y9 Acute embolism and thrombosis of unspecified deep veins of unspecified proximal lower extremity: Secondary | ICD-10-CM

## 2010-11-16 ENCOUNTER — Ambulatory Visit (HOSPITAL_COMMUNITY)
Admission: RE | Admit: 2010-11-16 | Discharge: 2010-11-16 | Disposition: A | Discharge status: Home / Self Care | Payer: BC Managed Care – PPO | Source: Ambulatory Visit | Attending: Oncology | Admitting: Oncology

## 2010-11-16 DIAGNOSIS — Z86718 Personal history of other venous thrombosis and embolism: Secondary | ICD-10-CM | POA: Insufficient documentation

## 2010-11-16 DIAGNOSIS — Z8672 Personal history of thrombophlebitis: Secondary | ICD-10-CM

## 2011-02-03 ENCOUNTER — Encounter: Payer: BC Managed Care – PPO | Admitting: Oncology

## 2011-11-16 ENCOUNTER — Other Ambulatory Visit: Payer: Self-pay | Admitting: Oncology

## 2011-11-17 ENCOUNTER — Telehealth: Payer: Self-pay | Admitting: Oncology

## 2011-11-17 NOTE — Telephone Encounter (Signed)
S/w pt re appt for 9/13.

## 2012-01-05 ENCOUNTER — Ambulatory Visit (HOSPITAL_BASED_OUTPATIENT_CLINIC_OR_DEPARTMENT_OTHER): Payer: BC Managed Care – PPO | Admitting: Oncology

## 2012-01-05 DIAGNOSIS — D6859 Other primary thrombophilia: Secondary | ICD-10-CM

## 2012-01-05 DIAGNOSIS — I8290 Acute embolism and thrombosis of unspecified vein: Secondary | ICD-10-CM

## 2012-01-05 DIAGNOSIS — I82409 Acute embolism and thrombosis of unspecified deep veins of unspecified lower extremity: Secondary | ICD-10-CM

## 2012-01-05 NOTE — Progress Notes (Signed)
Hematology and Oncology Follow Up Visit  Lindsey Wall [id number] Mar 24, 1951 61 y.o. 01/05/2012 3:25 PM   Principle Diagnosis: 61 year old with bilateral deep vein thrombosis diagnosed in July of 2011. She was treated with Coumadin and subsequently with Lovenox. She was diagnosed with heterozygous factor V Leiden mutation.  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  Current therapy: She is currently on aspirin 325 mg daily  Interim History: Lindsey Wall presents today for a followup visit. She is a 61 year old woman with the above history. Since her last visit she has been doing very well without any new complaints. She had not had any new blood clots or phlebitis. She is doing very well with her mobility and her knees are healing very well and have finished physical therapy. She had not had any other thrombotic events including DVT, PE or superficial phlebitis. She had not had any bleeding problems and continue to have her IVC filter in place.  Medications: I have reviewed the patient's current medications. Current outpatient prescriptions:aspirin 325 MG tablet, Take 325 mg by mouth daily., Disp: , Rfl: ;  baclofen (LIORESAL) 10 MG tablet, Take 10 mg by mouth Three times a day. 2 with breakfast, 2 with supper, 3 at bedtime, Disp: , Rfl: ;  escitalopram (LEXAPRO) 20 MG tablet, Take 20 mg by mouth daily., Disp: , Rfl:  gabapentin (NEURONTIN) 300 MG capsule, Take 300 mg by mouth Three times a day. 2 with breakfast, 2 with supper, 3 at bedtime, Disp: , Rfl:   Allergies: No Known Allergies  Past Medical History, Surgical history, Social history, and Family History were reviewed and updated.  Review of Systems: Constitutional:  Negative for fever, chills, night sweats, anorexia, weight loss, pain. Cardiovascular: no chest pain or dyspnea on exertion Respiratory: negative Neurological:  negative Dermatological: negative ENT: negative Skin: Negative. Gastrointestinal: no abdominal pain, change in bowel habits, or black or bloody stools Genito-Urinary: negative Hematological and Lymphatic: negative Breast: negative Musculoskeletal: negative Remaining ROS negative. Physical Exam: There were no vitals taken for this visit. ECOG: 1 General appearance: alert Head: Normocephalic, without obvious abnormality, atraumatic Neck: no adenopathy, no carotid bruit, no JVD, supple, symmetrical, trachea midline and thyroid not enlarged, symmetric, no tenderness/mass/nodules Lymph nodes: Cervical, supraclavicular, and axillary nodes normal. Heart:regular rate and rhythm, S1, S2 normal, no murmur, click, rub or gallop Lung:chest clear, no wheezing, rales, normal symmetric air entry Abdomin: soft, non-tender, without masses or organomegaly EXT:no erythema, induration, or nodules   Lab Results: Lab Results  Component Value Date   WBC 7.0 09/19/2010   HGB 8.2* 09/19/2010   HCT 23.8* 09/19/2010   MCV 93.0 09/19/2010   PLT 304 09/19/2010     Chemistry      Component Value Date/Time   NA 139 09/16/2010 0600   K 3.8 09/16/2010 0600   CL 105 09/16/2010 0600   CO2 29 09/16/2010 0600   BUN 7 09/16/2010 0600   CREATININE <0.47 09/16/2010 0600      Component Value Date/Time   CALCIUM 8.3* 09/16/2010 0600   ALKPHOS 67 09/16/2010 0600   AST 15 09/16/2010 0600   ALT 18 09/16/2010 0600   BILITOT 0.3 09/16/2010 0600      Impression and Plan:  61 year old woman with the following issues:  1. Deep vein thrombosis in the setting of heterozygous factor V Leiden mutation. She has been treated with Lovenox for about a year  for what we presume was a provoked clot from a mobility and osteoarthritis. Since her bilateral knee replacement, she has been fully mobile and have been on aspirin with an IVC filter without any complications. For the time being, I do not recommend any further anticoagulation  unless she develops any new clots. At that time she would require full dose anticoagulation probably lifetime.  2. Bilateral knee replacement. She is doing very well without and fully recovered.  3. Inferior vena cava filter placement based on the patient's wishes the filter will be will be left in place for the time being.  4. followup: In one year or sooner if needed.  [facility], MD 9/13/20133:25 PM

## 2012-01-09 ENCOUNTER — Telehealth: Payer: Self-pay | Admitting: Oncology

## 2012-01-09 NOTE — Telephone Encounter (Signed)
S.W. Pt and made aware of nxt appt on 9.12.14

## 2013-01-01 ENCOUNTER — Telehealth: Payer: Self-pay | Admitting: Oncology

## 2013-01-01 NOTE — Telephone Encounter (Signed)
Pt called and r/s lab Md vsiit to october 2014

## 2013-01-03 ENCOUNTER — Ambulatory Visit: Payer: BC Managed Care – PPO | Admitting: Oncology

## 2013-02-20 ENCOUNTER — Ambulatory Visit (HOSPITAL_BASED_OUTPATIENT_CLINIC_OR_DEPARTMENT_OTHER): Payer: Managed Care, Other (non HMO) | Admitting: Oncology

## 2013-02-20 VITALS — BP 120/73 | HR 57 | Temp 97.7°F | Resp 18 | Wt 159.5 lb

## 2013-02-20 DIAGNOSIS — I82409 Acute embolism and thrombosis of unspecified deep veins of unspecified lower extremity: Secondary | ICD-10-CM

## 2013-02-20 DIAGNOSIS — D6859 Other primary thrombophilia: Secondary | ICD-10-CM

## 2013-02-20 NOTE — Progress Notes (Signed)
Hematology and Oncology Follow Up Visit  Lindsey Wall [id number] 12/11/1950 62 y.o. 02/20/2013 3:34 PM   Principle Diagnosis: 62 year old with bilateral deep vein thrombosis diagnosed in July of 2011. She was treated with Coumadin and subsequently with Lovenox. She was diagnosed with heterozygous factor V Leiden mutation.  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  Current therapy: She is currently on aspirin 325 mg daily  Interim History: Lindsey Wall presents today for a followup visit. Since her last visit she has been doing very well without any new complaints. She had not had any new blood clots or phlebitis. She had not had any other thrombotic events including DVT, PE or superficial phlebitis. She had not had any bleeding problems and continue to have her IVC filter in place. She reported no complications associated with aspirin and had not had any dyspepsia or bleeding. She does report occasional lower show many swelling after an extended period of walking or activity.  Medications: I have reviewed the patient's current medications.  Current Outpatient Prescriptions  Medication Sig Dispense Refill  . aspirin 325 MG tablet Take 325 mg by mouth daily.      . baclofen (LIORESAL) 10 MG tablet Take 10 mg by mouth Three times a day. 2 with breakfast, 2 with supper, 3 at bedtime      . escitalopram (LEXAPRO) 20 MG tablet Take 20 mg by mouth daily.      Marland Kitchen gabapentin (NEURONTIN) 300 MG capsule Take 300 mg by mouth Three times a day. 2 with breakfast, 2 with supper, 3 at bedtime       No current facility-administered medications for this visit.    Allergies: No Known Allergies  Past Medical History, Surgical history, Social history, and Family History were reviewed and updated.  Review of Systems:  Remaining ROS negative. Physical Exam: Blood pressure 120/73, pulse 57, temperature  97.7 F (36.5 C), temperature source Oral, resp. rate 18, weight 159 lb 8 oz (72.349 kg), SpO2 98.00%. ECOG: 1 General appearance: alert Head: Normocephalic, without obvious abnormality, atraumatic Neck: no adenopathy, no carotid bruit, no JVD, supple, symmetrical, trachea midline and thyroid not enlarged, symmetric, no tenderness/mass/nodules Lymph nodes: Cervical, supraclavicular, and axillary nodes normal. Heart:regular rate and rhythm, S1, S2 normal, no murmur, click, rub or gallop Lung:chest clear, no wheezing, rales, normal symmetric air entry Abdomin: soft, non-tender, without masses or organomegaly EXT:no erythema, induration, or nodules   Lab Results: Lab Results  Component Value Date   WBC 7.0 09/19/2010   HGB 8.2* 09/19/2010   HCT 23.8* 09/19/2010   MCV 93.0 09/19/2010   PLT 304 09/19/2010     Chemistry      Component Value Date/Time   NA 139 09/16/2010 0600   K 3.8 09/16/2010 0600   CL 105 09/16/2010 0600   CO2 29 09/16/2010 0600   BUN 7 09/16/2010 0600   CREATININE <0.47 09/16/2010 0600      Component Value Date/Time   CALCIUM 8.3* 09/16/2010 0600   ALKPHOS 67 09/16/2010 0600   AST 15 09/16/2010 0600   ALT 18 09/16/2010 0600   BILITOT 0.3 09/16/2010 0600      Impression and Plan:  62 year old woman with the following issues:  1. Deep vein thrombosis in the setting of heterozygous factor V Leiden mutation. She has been treated with Lovenox for about a year for what we presume was a provoked clot from  a mobility and osteoarthritis. Since her bilateral knee replacement, she has been fully mobile and have been on aspirin with an IVC filter without any complications. For the time being, I do not recommend any further anticoagulation unless she develops any new clots. At that time she would require full dose anticoagulation probably lifetime.  2. Bilateral knee replacement. She is doing very well without and fully recovered.  3. Inferior vena cava filter placement based on the  patient's wishes the filter will be will be left in place for the time being.  4. followup: In one year or sooner if needed.  Eli Hose, MD 10/30/20143:34 PM

## 2013-02-21 ENCOUNTER — Telehealth: Payer: Self-pay | Admitting: Oncology

## 2013-02-21 NOTE — Telephone Encounter (Signed)
lvm for pt for OCT 2015 appt...mailed pt avs and letter

## 2013-11-28 ENCOUNTER — Encounter: Payer: Self-pay | Admitting: Internal Medicine

## 2013-11-28 NOTE — Telephone Encounter (Signed)
Error

## 2014-01-16 ENCOUNTER — Encounter: Payer: Self-pay | Admitting: Family Medicine

## 2014-01-16 ENCOUNTER — Telehealth: Payer: Self-pay | Admitting: Oncology

## 2014-01-16 NOTE — Telephone Encounter (Signed)
S/w pt to advise appt chg due to md on call 10/30. Gave new appt 10/29 and mailed appt calendar.

## 2014-02-06 ENCOUNTER — Telehealth: Payer: Self-pay | Admitting: Oncology

## 2014-02-06 NOTE — Telephone Encounter (Signed)
Close encounter 

## 2014-02-10 ENCOUNTER — Encounter: Payer: Self-pay | Admitting: Internal Medicine

## 2014-02-19 ENCOUNTER — Ambulatory Visit: Payer: Managed Care, Other (non HMO) | Admitting: Oncology

## 2014-02-20 ENCOUNTER — Ambulatory Visit: Payer: Managed Care, Other (non HMO) | Admitting: Oncology

## 2014-02-25 ENCOUNTER — Other Ambulatory Visit: Payer: Self-pay | Admitting: Obstetrics and Gynecology

## 2014-02-27 LAB — CYTOLOGY - PAP

## 2014-03-05 ENCOUNTER — Ambulatory Visit (HOSPITAL_BASED_OUTPATIENT_CLINIC_OR_DEPARTMENT_OTHER): Payer: Managed Care, Other (non HMO) | Admitting: Oncology

## 2014-03-05 ENCOUNTER — Telehealth: Payer: Self-pay | Admitting: Oncology

## 2014-03-05 VITALS — BP 124/75 | HR 59 | Temp 98.1°F | Resp 18 | Ht 64.0 in | Wt 160.1 lb

## 2014-03-05 DIAGNOSIS — I82409 Acute embolism and thrombosis of unspecified deep veins of unspecified lower extremity: Secondary | ICD-10-CM

## 2014-03-05 DIAGNOSIS — D6851 Activated protein C resistance: Secondary | ICD-10-CM

## 2014-03-05 DIAGNOSIS — I824Y9 Acute embolism and thrombosis of unspecified deep veins of unspecified proximal lower extremity: Secondary | ICD-10-CM

## 2014-03-05 NOTE — Telephone Encounter (Signed)
Pt confirmed MD visit per 11/12 POF, gave pt AVS..... KJ °

## 2014-03-05 NOTE — Progress Notes (Signed)
Hematology and Oncology Follow Up Visit  Lindsey Wall [id number] October 09, 1950 63 y.o. 03/05/2014 4:30 PM   Principle Diagnosis: 63 year old with bilateral deep vein thrombosis diagnosed in July of 2011. She was treated with Coumadin and subsequently with Lovenox. She was diagnosed with heterozygous factor V Leiden mutation.  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  Current therapy: She is currently on aspirin 325 mg daily  Interim History: Lindsey Wall presents today for a followup visit. Since her last visit she reports no new complaints. She continues to be on aspirin without any complications. She does report occasional lower extremity swelling and pain but is very limited and manageable.She had not had any new blood clots or phlebitis. She had not had any other thrombotic events including DVT, PE or superficial phlebitis. She had not had any bleeding problems and continue to have her IVC filter in place. She reports no dyspepsia or bleeding. She does not report any headaches or blurry vision or syncope. She does not report any chest pain or difficulty breathing. She does not report any nausea or vomiting or abdominal pain. She does not report any frequency urgency or hesitancy she does not report any skeletal complaints. Rest of her review of systems unremarkable.  Medications: I have reviewed the patient's current medications.  Current Outpatient Prescriptions  Medication Sig Dispense Refill  . amoxicillin (AMOXIL) 500 MG capsule   0  . aspirin 325 MG tablet Take 325 mg by mouth daily.    . baclofen (LIORESAL) 10 MG tablet Take 10 mg by mouth Three times a day. 2 with breakfast, 2 with supper, 3 at bedtime    . baclofen (LIORESAL) 20 MG tablet   5  . escitalopram (LEXAPRO) 20 MG tablet Take 20 mg by mouth daily.    . fluticasone (FLONASE) 50 MCG/ACT nasal spray   0  . gabapentin  (NEURONTIN) 300 MG capsule Take 300 mg by mouth Three times a day. 2 with breakfast, 2 with supper, 3 at bedtime    . gabapentin (NEURONTIN) 600 MG tablet   5  . valACYclovir (VALTREX) 1000 MG tablet   0   No current facility-administered medications for this visit.    Allergies: No Known Allergies  Past Medical History, Surgical history, Social history, and Family History were reviewed and updated.   Physical Exam: Blood pressure 124/75, pulse 59, temperature 98.1 F (36.7 C), temperature source Oral, resp. rate 18, height 5\' 4"  (1.626 m), weight 160 lb 1.6 oz (72.621 kg). ECOG: 1 General appearance: alert Head: Normocephalic, without obvious abnormality, atraumatic Neck: no adenopathy Lymph nodes: Cervical, supraclavicular, and axillary nodes normal. Heart:regular rate and rhythm, S1, S2 normal, no murmur, click, rub or gallop Lung:chest clear, no wheezing, rales, normal symmetric air entry Abdomin: soft, non-tender, without masses or organomegaly EXT:no erythema, induration, or nodules   Lab Results: Lab Results  Component Value Date   WBC 7.0 09/19/2010   HGB 8.2* 09/19/2010   HCT 23.8* 09/19/2010   MCV 93.0 09/19/2010   PLT 304 09/19/2010     Chemistry      Component Value Date/Time   NA 139 09/16/2010 0600   K 3.8 09/16/2010 0600   CL 105 09/16/2010 0600   CO2 29 09/16/2010 0600   BUN 7 09/16/2010 0600   CREATININE <0.47 09/16/2010 0600      Component Value Date/Time   CALCIUM 8.3* 09/16/2010 0600  ALKPHOS 67 09/16/2010 0600   AST 15 09/16/2010 0600   ALT 18 09/16/2010 0600   BILITOT 0.3 09/16/2010 0600      Impression and Plan:  63 year old woman with the following issues:  1. Deep vein thrombosis in the setting of heterozygous factor V Leiden mutation. She has been treated with Lovenox for about a year for what we presume was a provoked clot from a mobility and osteoarthritis. Since her bilateral knee replacement, she has been doing well without any  complications. I have continued to recommend aspirin only for anticoagulation.  2. Bilateral knee replacement. She is doing very well without and fully recovered.  3. Inferior vena cava filter placement based on the patient's wishes the filter will be will be left in place for the time being.  4. Followup: In one year.  Eli HoseSHADAD,FIRAS, MD 11/12/20154:30 PM

## 2014-03-11 ENCOUNTER — Ambulatory Visit (AMBULATORY_SURGERY_CENTER): Payer: Self-pay | Admitting: *Deleted

## 2014-03-11 VITALS — Ht 63.5 in | Wt 160.2 lb

## 2014-03-11 DIAGNOSIS — Z1211 Encounter for screening for malignant neoplasm of colon: Secondary | ICD-10-CM

## 2014-03-11 MED ORDER — MOVIPREP 100 G PO SOLR: 1.0000 | Freq: Once | ORAL | Status: DC

## 2014-03-11 NOTE — Progress Notes (Signed)
No egg or soy allergy. ewm No home 02 use. ewm No problems with past sedation. ewm No blood thinners, no diet pills. ewm

## 2014-04-01 ENCOUNTER — Encounter: Payer: Self-pay | Admitting: Internal Medicine

## 2014-04-01 ENCOUNTER — Ambulatory Visit (AMBULATORY_SURGERY_CENTER): Payer: Managed Care, Other (non HMO) | Admitting: Internal Medicine

## 2014-04-01 VITALS — BP 143/77 | HR 56 | Temp 98.9°F | Resp 21 | Ht 63.5 in | Wt 160.0 lb

## 2014-04-01 DIAGNOSIS — Z1211 Encounter for screening for malignant neoplasm of colon: Secondary | ICD-10-CM

## 2014-04-01 MED ORDER — SODIUM CHLORIDE 0.9 % IV SOLN: 500.0000 mL | INTRAVENOUS | Status: DC

## 2014-04-01 NOTE — Op Note (Signed)
[facility] [address] [place], [id number]   COLONOSCOPY PROCEDURE REPORT  PATIENT: Lindsey Wall, Lindsey Wall  MR#: [id number] BIRTHDATE: [birth year] , 63  yrs. old GENDER: female ENDOSCOPIST: Hart Carwinora M Marialuiza Car, MD REFERRED BY:Dr Shirlean Mylararol Webb PROCEDURE DATE:  04/01/2014 PROCEDURE:   Colonoscopy, screening First Screening Colonoscopy - Avg.  risk and is 50 yrs.  old or older - No.  Prior Negative Screening - Now for repeat screening. 10 or more years since last screening  History of Adenoma - Now for follow-up colonoscopy & has been > or = to 3 yrs.  N/A  Polyps Removed Today? No. ASA CLASS:   Class II INDICATIONS:average risk for colon cancer and last colonoscopy August 2005 was a normal exam except for hypertrophied papillae in the rectum. MEDICATIONS: Monitored anesthesia care and Propofol 200 mg IV  DESCRIPTION OF PROCEDURE:   After the risks benefits and alternatives of the procedure were thoroughly explained, informed consent was obtained.  The digital rectal exam revealed no abnormalities of the rectum.   The LB [id number]-[id number]  endoscope was introduced through the anus and advanced to the cecum, which was identified by both the appendix and ileocecal valve. No adverse events experienced.   The quality of the prep was excellent, using MoviPrep  The instrument was then slowly withdrawn as the colon was fully examined.      COLON FINDINGS: Small internal hemorrhoids were found.   There was mild diverticulosis noted in the descending colon.  Retroflexed views revealed no abnormalities. The time to cecum=8 minutes 53 seconds.  Withdrawal time=6 minutes 00 seconds.  The scope was withdrawn and the procedure completed. COMPLICATIONS: There were no immediate complications.  ENDOSCOPIC IMPRESSION: 1.   Small internal hemorrhoids 2.   Mild diverticulosis was noted in the descending colon  RECOMMENDATIONS: High-fiber diet Recall colonoscopy 10 years  eSigned:  Hart Carwinora M  Shekera Beavers, MD 04/01/2014 1:57 PM   cc:   PATIENT NAME:  Lindsey Wall, Lindsey Wall MR#: [id number]

## 2014-04-01 NOTE — Patient Instructions (Signed)
YOU HAD AN ENDOSCOPIC PROCEDURE TODAY AT THE [facility]: Refer to the procedure report that was given to you for any specific questions about what was found during the examination.  If the procedure report does not answer your questions, please call your gastroenterologist to clarify.  If you requested that your care partner not be given the details of your procedure findings, then the procedure report has been included in a sealed envelope for you to review at your convenience later.  YOU SHOULD EXPECT: Some feelings of bloating in the abdomen. Passage of more gas than usual.  Walking can help get rid of the air that was put into your GI tract during the procedure and reduce the bloating. If you had a lower endoscopy (such as a colonoscopy or flexible sigmoidoscopy) you may notice spotting of blood in your stool or on the toilet paper. If you underwent a bowel prep for your procedure, then you may not have a normal bowel movement for a few days.  DIET: Your first meal following the procedure should be a light meal and then it is ok to progress to your normal diet.  A half-sandwich or bowl of soup is an example of a good first meal.  Heavy or fried foods are harder to digest and may make you feel nauseous or bloated.  Likewise meals heavy in dairy and vegetables can cause extra gas to form and this can also increase the bloating.  Drink plenty of fluids but you should avoid alcoholic beverages for 24 hours.  ACTIVITY: Your care partner should take you home directly after the procedure.  You should plan to take it easy, moving slowly for the rest of the day.  You can resume normal activity the day after the procedure however you should NOT DRIVE or use heavy machinery for 24 hours (because of the sedation medicines used during the test).    SYMPTOMS TO REPORT IMMEDIATELY: A gastroenterologist can be reached at any hour.  During normal business hours, 8:30 AM to 5:00 PM Monday through Friday,  call [phone].  After hours and on weekends, please call the GI answering service at [phone] who will take a message and have the physician on call contact you.   Following lower endoscopy (colonoscopy or flexible sigmoidoscopy):  Excessive amounts of blood in the stool  Significant tenderness or worsening of abdominal pains  Swelling of the abdomen that is new, acute  Fever of 100F or higher  FOLLOW UP: Our staff will call the home number listed on your records the next business day following your procedure to check on you and address any questions or concerns that you may have at that time regarding the information given to you following your procedure. This is a courtesy call and so if there is no answer at the home number and we have not heard from you through the emergency physician on call, we will assume that you have returned to your regular daily activities without incident.  SIGNATURES/CONFIDENTIALITY: You and/or your care partner have signed paperwork which will be entered into your electronic medical record.  These signatures attest to the fact that that the information above on your After Visit Summary has been reviewed and is understood.  Full responsibility of the confidentiality of this discharge information lies with you and/or your care-partner.  Next colonoscopy- 10 years  Please read over handouts about high fiber diets, diverticulosis and high fiber diets  Continue your normal medications

## 2014-04-01 NOTE — Progress Notes (Signed)
Patient awakening,vss,report to rn 

## 2014-04-02 ENCOUNTER — Telehealth: Payer: Self-pay | Admitting: *Deleted

## 2014-04-02 NOTE — Telephone Encounter (Signed)
No answer. Number identifier. Message left to call if questions or concerns. 

## 2014-09-22 ENCOUNTER — Other Ambulatory Visit: Payer: Self-pay | Admitting: Neurological Surgery

## 2014-09-22 DIAGNOSIS — M5416 Radiculopathy, lumbar region: Secondary | ICD-10-CM

## 2014-09-24 ENCOUNTER — Other Ambulatory Visit: Payer: Self-pay | Admitting: Neurological Surgery

## 2014-09-24 ENCOUNTER — Ambulatory Visit
Admission: RE | Admit: 2014-09-24 | Discharge: 2014-09-24 | Disposition: A | Discharge status: Home / Self Care | Payer: Managed Care, Other (non HMO) | Source: Ambulatory Visit | Attending: Neurological Surgery | Admitting: Neurological Surgery

## 2014-09-24 DIAGNOSIS — M5416 Radiculopathy, lumbar region: Secondary | ICD-10-CM

## 2014-10-05 ENCOUNTER — Encounter: Payer: Self-pay | Admitting: *Deleted

## 2015-03-05 ENCOUNTER — Ambulatory Visit (HOSPITAL_BASED_OUTPATIENT_CLINIC_OR_DEPARTMENT_OTHER): Payer: Managed Care, Other (non HMO) | Admitting: Oncology

## 2015-03-05 VITALS — BP 121/69 | HR 61 | Temp 98.1°F | Resp 17 | Ht 63.5 in | Wt 166.3 lb

## 2015-03-05 DIAGNOSIS — D6851 Activated protein C resistance: Secondary | ICD-10-CM | POA: Diagnosis not present

## 2015-03-05 DIAGNOSIS — I82403 Acute embolism and thrombosis of unspecified deep veins of lower extremity, bilateral: Secondary | ICD-10-CM

## 2015-03-05 DIAGNOSIS — I82409 Acute embolism and thrombosis of unspecified deep veins of unspecified lower extremity: Secondary | ICD-10-CM

## 2015-03-05 NOTE — Progress Notes (Signed)
Hematology and Oncology Follow Up Visit  Lindsey RobertsFaye E Wall [id number] 14-Jun-1950 64 y.o. 03/05/2015 4:15 PM   Principle Diagnosis: 64 year old with bilateral deep vein thrombosis diagnosed in July of 2011. She was treated with Coumadin and subsequently with Lovenox. She was diagnosed with heterozygous factor V Leiden mutation.  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  Current therapy: She is currently on aspirin 325 mg daily  Interim History: Lindsey Wall presents today for a followup visit. Since her last visit, she reports complaints of neck pain and back pain. She has severe degenerative arthritis and is in the process of getting cervical spine surgery in the near future.  She continues to be on aspirin without any complications. Has not reported any bleeding or thrombosis. She does report occasional lower extremity swelling and pain but would not change dramatically. she is ambulating without any difficulties. Has not had any falls or syncope.   She had not had any other thrombotic events including DVT, PE or superficial phlebitis. She had not had any bleeding problems and continue to have her IVC filter in place. She reports no dyspepsia or bleeding.   She does not report any headaches or blurry vision or syncope. She does not report any chest pain or difficulty breathing. She does not report any nausea or vomiting or abdominal pain. She does not report any frequency urgency or hesitancy she does not report any skeletal complaints. Rest of her review of systems unremarkable.  Medications: I have reviewed the patient's current medications.  Current Outpatient Prescriptions  Medication Sig Dispense Refill  . amoxicillin (AMOXIL) 500 MG capsule   0  . aspirin 325 MG tablet Take 325 mg by mouth daily.    . baclofen (LIORESAL) 20 MG tablet Take 20 mg by mouth 3 (three) times daily.   5  .  cholecalciferol (VITAMIN D) 1000 UNITS tablet Take 1,000 Units by mouth daily.    Marland Kitchen. escitalopram (LEXAPRO) 20 MG tablet Take 20 mg by mouth daily.    . fluticasone (FLONASE) 50 MCG/ACT nasal spray   0  . gabapentin (NEURONTIN) 300 MG capsule Take 300 mg by mouth Three times a day. 2 with breakfast, 2 with supper, 3 at bedtime    . gabapentin (NEURONTIN) 600 MG tablet   5  . valACYclovir (VALTREX) 1000 MG tablet   0  . vitamin E 400 UNIT capsule Take 400 Units by mouth daily.     No current facility-administered medications for this visit.    Allergies:  Allergies  Allergen Reactions  . Erythromycin Nausea And Vomiting    Past Medical History, Surgical history, Social history, and Family History were reviewed and updated.   Physical Exam: Blood pressure 121/69, pulse 61, temperature 98.1 F (36.7 C), temperature source Oral, resp. rate 17, height 5' 3.5" (1.613 m), weight 166 lb 4.8 oz (75.433 kg), SpO2 100 %. ECOG: 1 General appearance: alert awake woman without distress.  Head: Normocephalic, without obvious abnormality without oral ulcers or lesions.  Neck: no adenopathy Lymph nodes: Cervical, supraclavicular, and axillary nodes normal. Heart:regular rate and rhythm, S1, S2 normal, no murmur, click, rub or gallop Lung:chest clear, no wheezing, rales, normal symmetric air entry Abdomin: soft, non-tender, without masses or organomegaly no shifting dullness or ascites.  EXT:no erythema, induration, or nodules.No edema noted.   Lab Results: Lab Results  Component Value Date   WBC 7.0 09/19/2010  HGB 8.2* 09/19/2010   HCT 23.8* 09/19/2010   MCV 93.0 09/19/2010   PLT 304 09/19/2010     Chemistry      Component Value Date/Time   NA 139 09/16/2010 0600   K 3.8 09/16/2010 0600   CL 105 09/16/2010 0600   CO2 29 09/16/2010 0600   BUN 7 09/16/2010 0600   CREATININE <0.47 09/16/2010 0600      Component Value Date/Time   CALCIUM 8.3* 09/16/2010 0600   ALKPHOS 67 09/16/2010  0600   AST 15 09/16/2010 0600   ALT 18 09/16/2010 0600   BILITOT 0.3 09/16/2010 0600      Impression and Plan:  64 year old woman with the following issues:  1. Deep vein thrombosis in the setting of heterozygous factor V Leiden mutation. She has been treated with Lovenox in the past but have been recently on aspirin. She has not had any recent thrombosis episodes. No reason to change to a different anticoagulation chronically.  2. Cervical spine surgery: No contraindication at this point but I have instructed her to stop aspirin at least 7 days prior to her surgery. In terms of postoperative thrombosis prophylaxis have recommended Lovenox postoperatively until she is fully mobilized. At that time, she can resume aspirin 325 mg daily. If she is anticipated to be immobilized for an extended period of time, I have recommended therapeutic doses of Lovenox to be given at least for 3-4 weeks.  3. Inferior vena cava filter placement: This has been placed previously which will offer protection to prevent pulmonary embolism.  4. Followup: In 6 months to follow-up on her clinical status.   Eli Hose, MD 11/11/20164:15 PM

## 2015-07-08 DIAGNOSIS — M4712 Other spondylosis with myelopathy, cervical region: Secondary | ICD-10-CM | POA: Diagnosis not present

## 2015-12-06 DIAGNOSIS — J069 Acute upper respiratory infection, unspecified: Secondary | ICD-10-CM | POA: Diagnosis not present

## 2015-12-06 DIAGNOSIS — R69 Illness, unspecified: Secondary | ICD-10-CM | POA: Diagnosis not present

## 2015-12-06 DIAGNOSIS — R5383 Other fatigue: Secondary | ICD-10-CM | POA: Diagnosis not present

## 2015-12-15 DIAGNOSIS — Z0000 Encounter for general adult medical examination without abnormal findings: Secondary | ICD-10-CM | POA: Diagnosis not present

## 2015-12-15 DIAGNOSIS — Z23 Encounter for immunization: Secondary | ICD-10-CM | POA: Diagnosis not present

## 2015-12-15 DIAGNOSIS — E7800 Pure hypercholesterolemia, unspecified: Secondary | ICD-10-CM | POA: Diagnosis not present

## 2015-12-15 DIAGNOSIS — E559 Vitamin D deficiency, unspecified: Secondary | ICD-10-CM | POA: Diagnosis not present

## 2015-12-15 DIAGNOSIS — R69 Illness, unspecified: Secondary | ICD-10-CM | POA: Diagnosis not present

## 2016-03-08 ENCOUNTER — Telehealth: Payer: Self-pay | Admitting: *Deleted

## 2016-03-08 DIAGNOSIS — Z124 Encounter for screening for malignant neoplasm of cervix: Secondary | ICD-10-CM | POA: Diagnosis not present

## 2016-03-08 DIAGNOSIS — Z6828 Body mass index (BMI) 28.0-28.9, adult: Secondary | ICD-10-CM | POA: Diagnosis not present

## 2016-03-08 DIAGNOSIS — Z1231 Encounter for screening mammogram for malignant neoplasm of breast: Secondary | ICD-10-CM | POA: Diagnosis not present

## 2016-03-08 NOTE — Telephone Encounter (Signed)
"  I'm due for my annual appointment with Dr. Clelia CroftShadad for blood clots.  Call transferred to scheduler.

## 2016-03-15 ENCOUNTER — Telehealth: Payer: Self-pay | Admitting: Oncology

## 2016-03-15 ENCOUNTER — Ambulatory Visit (HOSPITAL_BASED_OUTPATIENT_CLINIC_OR_DEPARTMENT_OTHER): Payer: Medicare HMO | Admitting: Oncology

## 2016-03-15 VITALS — BP 120/67 | HR 73 | Temp 98.1°F | Resp 18 | Wt 160.7 lb

## 2016-03-15 DIAGNOSIS — Z7982 Long term (current) use of aspirin: Secondary | ICD-10-CM

## 2016-03-15 DIAGNOSIS — Z86718 Personal history of other venous thrombosis and embolism: Secondary | ICD-10-CM | POA: Diagnosis not present

## 2016-03-15 NOTE — Telephone Encounter (Signed)
Appointments scheduled per 03/15/16 los. AVS report and appointment schedule was given to patient, per 03/15/16 los.

## 2016-03-15 NOTE — Progress Notes (Signed)
Hematology and Oncology Follow Up Visit  Lindsey Wall [id number] 1950-05-22 65 y.o. 03/15/2016 3:27 PM   Principle Diagnosis: 65 year old with bilateral deep vein thrombosis diagnosed in July of 2011. She was treated with Coumadin and subsequently with Lovenox. She was diagnosed with heterozygous factor V Leiden mutation.  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  Current therapy: She is currently on aspirin 325 mg daily  Interim History: Lindsey Wall presents today for a followup visit. Since her last visit, she reports no major changes in her health. She had neck surgery done in the last year and tolerated it well without any complications. She denied any thrombosis or bleeding episodes. She continues to be on aspirin without any complications. She does report occasional lower extremity swelling and pain but would not change dramatically. she is ambulating without any difficulties. Has not had any falls or syncope. She does use support stocking which have helped decrease the swelling down.   She does not report any headaches or blurry vision or syncope. She does not report any chest pain or difficulty breathing. She does not report any nausea or vomiting or abdominal pain. She does not report any frequency urgency or hesitancy she does not report any skeletal complaints. Rest of her review of systems unremarkable.  Medications: I have reviewed the patient's current medications.  Current Outpatient Prescriptions  Medication Sig Dispense Refill  . amoxicillin (AMOXIL) 500 MG capsule   0  . aspirin 325 MG tablet Take 325 mg by mouth daily.    . baclofen (LIORESAL) 20 MG tablet Take 20 mg by mouth 3 (three) times daily.   5  . cholecalciferol (VITAMIN D) 1000 UNITS tablet Take 1,000 Units by mouth daily.    Marland Kitchen. escitalopram (LEXAPRO) 20 MG tablet Take 20 mg by mouth daily.    . fluticasone  (FLONASE) 50 MCG/ACT nasal spray   0  . gabapentin (NEURONTIN) 300 MG capsule Take 300 mg by mouth Three times a day. 2 with breakfast, 2 with supper, 3 at bedtime    . gabapentin (NEURONTIN) 600 MG tablet   5  . valACYclovir (VALTREX) 1000 MG tablet   0  . vitamin E 400 UNIT capsule Take 400 Units by mouth daily.     No current facility-administered medications for this visit.     Allergies:  Allergies  Allergen Reactions  . Erythromycin Nausea And Vomiting    Past Medical History, Surgical history, Social history, and Family History were reviewed and updated.   Physical Exam: Blood pressure 120/67, pulse 73, temperature 98.1 F (36.7 C), temperature source Oral, resp. rate 18, weight 160 lb 11.2 oz (72.9 kg), SpO2 98 %. ECOG: 1 General appearance: Well-appearing woman without distress. Head: Normocephalic, without obvious abnormality. No oral thrush. Neck: no adenopathy Lymph nodes: Cervical, supraclavicular, and axillary nodes normal. Heart:regular rate and rhythm, S1, S2 normal, no murmur, click, rub or gallop Lung:chest clear, no wheezing, rales, normal symmetric air entry Abdomin: soft, non-tender, without masses or organomegaly no rebound or guarding. EXT:no erythema, induration, or nodules.mild edema noted.   Lab Results: Lab Results  Component Value Date   WBC 7.0 09/19/2010   HGB 8.2 (L) 09/19/2010   HCT 23.8 (L) 09/19/2010   MCV 93.0 09/19/2010   PLT 304 09/19/2010     Chemistry      Component Value Date/Time   NA 139 09/16/2010 0600   K  3.8 09/16/2010 0600   CL 105 09/16/2010 0600   CO2 29 09/16/2010 0600   BUN 7 09/16/2010 0600   CREATININE <0.47 09/16/2010 0600      Component Value Date/Time   CALCIUM 8.3 (L) 09/16/2010 0600   ALKPHOS 67 09/16/2010 0600   AST 15 09/16/2010 0600   ALT 18 09/16/2010 0600   BILITOT 0.3 09/16/2010 0600      Impression and Plan:  65 year old woman with the following issues:  1. Deep vein thrombosis in the  setting of heterozygous factor V Leiden mutation. She has been treated with Lovenox in the past and currently on aspirin. She has not had any recent thrombosis episodes. No reason to change to a different anticoagulation chronically.  2. Cervical spine surgery: This was completed last year without issues.  3. Inferior vena cava filter placement: This has been placed previously which will offer protection to prevent pulmonary embolism.  4. Followup: In 12 months to follow-up on her clinical status.   Eli HoseSHADAD,Etienne Mowers, MD 11/22/20173:27 PM

## 2016-03-23 DIAGNOSIS — Z96653 Presence of artificial knee joint, bilateral: Secondary | ICD-10-CM | POA: Diagnosis not present

## 2016-04-06 DIAGNOSIS — L821 Other seborrheic keratosis: Secondary | ICD-10-CM | POA: Diagnosis not present

## 2016-04-19 DIAGNOSIS — Z6827 Body mass index (BMI) 27.0-27.9, adult: Secondary | ICD-10-CM | POA: Diagnosis not present

## 2016-04-19 DIAGNOSIS — Z0000 Encounter for general adult medical examination without abnormal findings: Secondary | ICD-10-CM | POA: Diagnosis not present

## 2017-01-03 DIAGNOSIS — Z5181 Encounter for therapeutic drug level monitoring: Secondary | ICD-10-CM | POA: Diagnosis not present

## 2017-01-03 DIAGNOSIS — Z0000 Encounter for general adult medical examination without abnormal findings: Secondary | ICD-10-CM | POA: Diagnosis not present

## 2017-01-03 DIAGNOSIS — R69 Illness, unspecified: Secondary | ICD-10-CM | POA: Diagnosis not present

## 2017-01-03 DIAGNOSIS — Z23 Encounter for immunization: Secondary | ICD-10-CM | POA: Diagnosis not present

## 2017-01-03 DIAGNOSIS — E7800 Pure hypercholesterolemia, unspecified: Secondary | ICD-10-CM | POA: Diagnosis not present

## 2017-03-08 ENCOUNTER — Telehealth: Payer: Self-pay | Admitting: *Deleted

## 2017-03-08 ENCOUNTER — Telehealth: Payer: Self-pay | Admitting: Oncology

## 2017-03-08 NOTE — Telephone Encounter (Signed)
"  I want you to look and see when my appointment is with Dr. Clelia CroftShadad.  I think it's November 21 st.  I need to change that."  Call transferred to high priority schedule line.

## 2017-03-08 NOTE — Telephone Encounter (Signed)
Spoke with patient and changed appointment for 11/29.

## 2017-03-14 ENCOUNTER — Ambulatory Visit: Payer: Medicare HMO | Admitting: Oncology

## 2017-03-22 ENCOUNTER — Ambulatory Visit (HOSPITAL_BASED_OUTPATIENT_CLINIC_OR_DEPARTMENT_OTHER): Payer: Medicare HMO | Admitting: Oncology

## 2017-03-22 ENCOUNTER — Telehealth: Payer: Self-pay | Admitting: Medical Oncology

## 2017-03-22 VITALS — BP 104/65 | HR 70 | Temp 98.7°F | Resp 19 | Ht 63.5 in | Wt 161.2 lb

## 2017-03-22 DIAGNOSIS — D6851 Activated protein C resistance: Secondary | ICD-10-CM

## 2017-03-22 DIAGNOSIS — Z7982 Long term (current) use of aspirin: Secondary | ICD-10-CM

## 2017-03-22 DIAGNOSIS — Z86718 Personal history of other venous thrombosis and embolism: Secondary | ICD-10-CM | POA: Diagnosis not present

## 2017-03-22 DIAGNOSIS — I82409 Acute embolism and thrombosis of unspecified deep veins of unspecified lower extremity: Secondary | ICD-10-CM

## 2017-03-22 NOTE — Telephone Encounter (Signed)
Appt today -Running late due to traffic. May be 2 pm .  I told her to come.

## 2017-03-22 NOTE — Progress Notes (Signed)
Hematology and Oncology Follow Up Visit  Lindsey RobertsFaye E Wall [id number] June 04, 1950 66 y.o. 03/22/2017 2:32 PM   Principle Diagnosis: 66 year old with bilateral deep vein thrombosis diagnosed in July of 2011. She was treated with Coumadin and subsequently with Lovenox. She was diagnosed with heterozygous factor V Leiden mutation.  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  Current therapy: She is currently on aspirin 325 mg daily  Interim History: [name] presents today for a followup visit. Since her last visit, she continues to be well without any complaints.  She denied any thrombosis or bleeding episodes. She continues to be on aspirin without any complications. She does report occasional lower extremity swelling and pain but would not change dramatically. she is ambulating without any difficulties.  She remains active attending to activities of daily living.  She is planning a trip to [place] to visit her mother.   She does not report any headaches or blurry vision or syncope. She does not report any chest pain or difficulty breathing. She does not report any nausea or vomiting or abdominal pain. She does not report any frequency urgency or hesitancy she does not report any skeletal complaints. Rest of her review of systems unremarkable.  Medications: I have reviewed the patient's current medications.  Current Outpatient Medications  Medication Sig Dispense Refill  . amoxicillin (AMOXIL) 500 MG capsule   0  . aspirin 325 MG tablet Take 325 mg by mouth daily.    . baclofen (LIORESAL) 20 MG tablet Take 20 mg by mouth 3 (three) times daily.   5  . cholecalciferol (VITAMIN D) 1000 UNITS tablet Take 1,000 Units by mouth daily.    Marland Kitchen. escitalopram (LEXAPRO) 20 MG tablet Take 20 mg by mouth daily.    . fluticasone (FLONASE) 50 MCG/ACT nasal spray   0  . gabapentin (NEURONTIN) 300 MG capsule  Take 300 mg by mouth Three times a day. 2 with breakfast, 2 with supper, 3 at bedtime    . gabapentin (NEURONTIN) 600 MG tablet   5  . valACYclovir (VALTREX) 1000 MG tablet   0  . vitamin E 400 UNIT capsule Take 400 Units by mouth daily.     No current facility-administered medications for this visit.     Allergies:  Allergies  Allergen Reactions  . Erythromycin Nausea And Vomiting    Past Medical History, Surgical history, Social history, and Family History were reviewed and updated.   Physical Exam: Blood pressure 104/65, pulse 70, temperature 98.7 F (37.1 C), temperature source Oral, resp. rate 19, height 5' 3.5" (1.613 m), weight 161 lb 3.2 oz (73.1 kg), SpO2 100 %. ECOG: 1 General appearance: Alert, awake woman without distress. Head: Normocephalic, without obvious abnormality. No oral ulcers or lesions. Neck: no adenopathy Lymph nodes: Cervical, supraclavicular, and axillary nodes normal. Heart:regular rate and rhythm, S1, S2 normal, no murmur, click, rub or gallop Lung:chest clear, no wheezing, rales, normal symmetric air entry Abdomin: soft, non-tender, without masses or organomegaly no shifting dullness or ascites. EXT:no erythema, induration, or nodules.mild edema noted.   Lab Results: Lab Results  Component Value Date   WBC 7.0 09/19/2010   HGB 8.2 (L) 09/19/2010   HCT 23.8 (L) 09/19/2010   MCV 93.0 09/19/2010   PLT 304 09/19/2010     Chemistry      Component Value Date/Time   NA 139 09/16/2010 0600   K 3.8 09/16/2010 0600  CL 105 09/16/2010 0600   CO2 29 09/16/2010 0600   BUN 7 09/16/2010 0600   CREATININE <0.47 09/16/2010 0600      Component Value Date/Time   CALCIUM 8.3 (L) 09/16/2010 0600   ALKPHOS 67 09/16/2010 0600   AST 15 09/16/2010 0600   ALT 18 09/16/2010 0600   BILITOT 0.3 09/16/2010 0600      Impression and Plan:  66-year-old woman with the following issues:  1. Deep vein thrombosis in the setting of heterozygous factor V Leiden  mutation. She has been treated with Lovenox in the past and currently on aspirin. She has not had any recent thrombosis episodes.   We have continued to discuss strategies to improve her risk of future thrombosis including continued ambulation and compression stockings.  2. Cervical spine surgery: No complication after that surgery.  There are consideration for possible lumbar spine operation which is deferred for the time being.  3. Inferior vena cava filter placement: This has been placed previously which will offer protection to prevent pulmonary embolism.  4. Followup: I am happy to see her in the future as needed.  Eli HoseFiras Zilda No, MD 11/29/20182:32 PM

## 2017-05-03 DIAGNOSIS — M8588 Other specified disorders of bone density and structure, other site: Secondary | ICD-10-CM | POA: Diagnosis not present

## 2017-05-03 DIAGNOSIS — N958 Other specified menopausal and perimenopausal disorders: Secondary | ICD-10-CM | POA: Diagnosis not present

## 2017-05-03 DIAGNOSIS — Z1231 Encounter for screening mammogram for malignant neoplasm of breast: Secondary | ICD-10-CM | POA: Diagnosis not present

## 2017-05-03 DIAGNOSIS — Z13820 Encounter for screening for osteoporosis: Secondary | ICD-10-CM | POA: Diagnosis not present

## 2017-05-03 DIAGNOSIS — Z6826 Body mass index (BMI) 26.0-26.9, adult: Secondary | ICD-10-CM | POA: Diagnosis not present

## 2017-05-03 DIAGNOSIS — Z01419 Encounter for gynecological examination (general) (routine) without abnormal findings: Secondary | ICD-10-CM | POA: Diagnosis not present

## 2017-05-08 ENCOUNTER — Other Ambulatory Visit: Payer: Self-pay | Admitting: Obstetrics and Gynecology

## 2017-05-08 DIAGNOSIS — R928 Other abnormal and inconclusive findings on diagnostic imaging of breast: Secondary | ICD-10-CM

## 2017-05-11 ENCOUNTER — Ambulatory Visit
Admission: RE | Admit: 2017-05-11 | Discharge: 2017-05-11 | Disposition: A | Discharge status: Home / Self Care | Payer: Medicare HMO | Source: Ambulatory Visit | Attending: Obstetrics and Gynecology | Admitting: Obstetrics and Gynecology

## 2017-05-11 ENCOUNTER — Other Ambulatory Visit: Payer: Self-pay | Admitting: Obstetrics and Gynecology

## 2017-05-11 ENCOUNTER — Other Ambulatory Visit: Payer: Medicare HMO

## 2017-05-11 DIAGNOSIS — R928 Other abnormal and inconclusive findings on diagnostic imaging of breast: Secondary | ICD-10-CM

## 2017-05-11 DIAGNOSIS — N6489 Other specified disorders of breast: Secondary | ICD-10-CM | POA: Diagnosis not present

## 2017-05-16 ENCOUNTER — Other Ambulatory Visit: Payer: Medicare HMO

## 2017-05-18 ENCOUNTER — Other Ambulatory Visit: Payer: Self-pay | Admitting: Obstetrics and Gynecology

## 2017-05-18 ENCOUNTER — Ambulatory Visit
Admission: RE | Admit: 2017-05-18 | Discharge: 2017-05-18 | Disposition: A | Discharge status: Home / Self Care | Payer: Medicare HMO | Source: Ambulatory Visit | Attending: Obstetrics and Gynecology | Admitting: Obstetrics and Gynecology

## 2017-05-18 DIAGNOSIS — N6002 Solitary cyst of left breast: Secondary | ICD-10-CM | POA: Diagnosis not present

## 2017-05-18 DIAGNOSIS — R928 Other abnormal and inconclusive findings on diagnostic imaging of breast: Secondary | ICD-10-CM

## 2018-02-25 DIAGNOSIS — R69 Illness, unspecified: Secondary | ICD-10-CM | POA: Diagnosis not present

## 2018-02-25 DIAGNOSIS — L219 Seborrheic dermatitis, unspecified: Secondary | ICD-10-CM | POA: Diagnosis not present

## 2018-02-25 DIAGNOSIS — Z5181 Encounter for therapeutic drug level monitoring: Secondary | ICD-10-CM | POA: Diagnosis not present

## 2018-02-25 DIAGNOSIS — Z0000 Encounter for general adult medical examination without abnormal findings: Secondary | ICD-10-CM | POA: Diagnosis not present

## 2018-02-25 DIAGNOSIS — E7800 Pure hypercholesterolemia, unspecified: Secondary | ICD-10-CM | POA: Diagnosis not present

## 2018-10-10 DIAGNOSIS — R509 Fever, unspecified: Secondary | ICD-10-CM | POA: Diagnosis not present

## 2018-10-11 DIAGNOSIS — R509 Fever, unspecified: Secondary | ICD-10-CM | POA: Diagnosis not present

## 2018-12-24 DIAGNOSIS — M8580 Other specified disorders of bone density and structure, unspecified site: Secondary | ICD-10-CM | POA: Diagnosis not present

## 2018-12-24 DIAGNOSIS — Z01419 Encounter for gynecological examination (general) (routine) without abnormal findings: Secondary | ICD-10-CM | POA: Diagnosis not present

## 2018-12-24 DIAGNOSIS — Z6829 Body mass index (BMI) 29.0-29.9, adult: Secondary | ICD-10-CM | POA: Diagnosis not present

## 2018-12-31 ENCOUNTER — Other Ambulatory Visit: Payer: Self-pay | Admitting: Obstetrics and Gynecology

## 2018-12-31 DIAGNOSIS — Z1231 Encounter for screening mammogram for malignant neoplasm of breast: Secondary | ICD-10-CM

## 2019-01-01 ENCOUNTER — Other Ambulatory Visit: Payer: Self-pay

## 2019-01-01 ENCOUNTER — Ambulatory Visit
Admission: RE | Admit: 2019-01-01 | Discharge: 2019-01-01 | Disposition: A | Discharge status: Home / Self Care | Payer: Medicare HMO | Source: Ambulatory Visit | Attending: Obstetrics and Gynecology | Admitting: Obstetrics and Gynecology

## 2019-01-01 DIAGNOSIS — Z1231 Encounter for screening mammogram for malignant neoplasm of breast: Secondary | ICD-10-CM

## 2019-04-07 DIAGNOSIS — Z1211 Encounter for screening for malignant neoplasm of colon: Secondary | ICD-10-CM | POA: Diagnosis not present

## 2019-04-07 DIAGNOSIS — Z0000 Encounter for general adult medical examination without abnormal findings: Secondary | ICD-10-CM | POA: Diagnosis not present

## 2019-04-15 DIAGNOSIS — Z5181 Encounter for therapeutic drug level monitoring: Secondary | ICD-10-CM | POA: Diagnosis not present

## 2019-04-15 DIAGNOSIS — E559 Vitamin D deficiency, unspecified: Secondary | ICD-10-CM | POA: Diagnosis not present

## 2019-04-15 DIAGNOSIS — E7800 Pure hypercholesterolemia, unspecified: Secondary | ICD-10-CM | POA: Diagnosis not present

## 2019-07-17 DIAGNOSIS — N958 Other specified menopausal and perimenopausal disorders: Secondary | ICD-10-CM | POA: Diagnosis not present

## 2019-07-17 DIAGNOSIS — M8588 Other specified disorders of bone density and structure, other site: Secondary | ICD-10-CM | POA: Diagnosis not present

## 2019-10-15 DIAGNOSIS — M810 Age-related osteoporosis without current pathological fracture: Secondary | ICD-10-CM | POA: Diagnosis not present

## 2019-11-12 DIAGNOSIS — M810 Age-related osteoporosis without current pathological fracture: Secondary | ICD-10-CM | POA: Diagnosis not present

## 2019-12-17 ENCOUNTER — Other Ambulatory Visit: Payer: Self-pay | Admitting: Family Medicine

## 2019-12-17 DIAGNOSIS — Z0000 Encounter for general adult medical examination without abnormal findings: Secondary | ICD-10-CM

## 2020-02-05 DIAGNOSIS — Z01419 Encounter for gynecological examination (general) (routine) without abnormal findings: Secondary | ICD-10-CM | POA: Diagnosis not present

## 2020-02-05 DIAGNOSIS — N952 Postmenopausal atrophic vaginitis: Secondary | ICD-10-CM | POA: Diagnosis not present

## 2020-02-05 DIAGNOSIS — Z6831 Body mass index (BMI) 31.0-31.9, adult: Secondary | ICD-10-CM | POA: Diagnosis not present

## 2020-02-05 DIAGNOSIS — M810 Age-related osteoporosis without current pathological fracture: Secondary | ICD-10-CM | POA: Diagnosis not present

## 2020-02-12 ENCOUNTER — Other Ambulatory Visit: Payer: Self-pay

## 2020-02-12 ENCOUNTER — Ambulatory Visit
Admission: RE | Admit: 2020-02-12 | Discharge: 2020-02-12 | Disposition: A | Discharge status: Home / Self Care | Payer: Medicare HMO | Source: Ambulatory Visit | Attending: Family Medicine | Admitting: Family Medicine

## 2020-02-12 DIAGNOSIS — Z0000 Encounter for general adult medical examination without abnormal findings: Secondary | ICD-10-CM

## 2020-02-12 DIAGNOSIS — Z1231 Encounter for screening mammogram for malignant neoplasm of breast: Secondary | ICD-10-CM | POA: Diagnosis not present

## 2020-02-26 DIAGNOSIS — H5203 Hypermetropia, bilateral: Secondary | ICD-10-CM | POA: Diagnosis not present

## 2020-03-15 DIAGNOSIS — M810 Age-related osteoporosis without current pathological fracture: Secondary | ICD-10-CM | POA: Diagnosis not present

## 2020-05-18 DIAGNOSIS — E7800 Pure hypercholesterolemia, unspecified: Secondary | ICD-10-CM | POA: Diagnosis not present

## 2020-05-18 DIAGNOSIS — Z0000 Encounter for general adult medical examination without abnormal findings: Secondary | ICD-10-CM | POA: Diagnosis not present

## 2020-05-18 DIAGNOSIS — Z5181 Encounter for therapeutic drug level monitoring: Secondary | ICD-10-CM | POA: Diagnosis not present

## 2020-05-18 DIAGNOSIS — E559 Vitamin D deficiency, unspecified: Secondary | ICD-10-CM | POA: Diagnosis not present

## 2020-05-18 DIAGNOSIS — R69 Illness, unspecified: Secondary | ICD-10-CM | POA: Diagnosis not present

## 2020-09-30 DIAGNOSIS — M810 Age-related osteoporosis without current pathological fracture: Secondary | ICD-10-CM | POA: Diagnosis not present

## 2020-10-19 DIAGNOSIS — N958 Other specified menopausal and perimenopausal disorders: Secondary | ICD-10-CM | POA: Diagnosis not present

## 2020-10-19 DIAGNOSIS — M8588 Other specified disorders of bone density and structure, other site: Secondary | ICD-10-CM | POA: Diagnosis not present

## 2020-12-01 DIAGNOSIS — M810 Age-related osteoporosis without current pathological fracture: Secondary | ICD-10-CM | POA: Diagnosis not present

## 2021-03-10 ENCOUNTER — Other Ambulatory Visit: Payer: Self-pay | Admitting: Family Medicine

## 2021-03-10 DIAGNOSIS — Z1231 Encounter for screening mammogram for malignant neoplasm of breast: Secondary | ICD-10-CM

## 2021-03-21 DIAGNOSIS — M810 Age-related osteoporosis without current pathological fracture: Secondary | ICD-10-CM | POA: Diagnosis not present

## 2021-03-29 DIAGNOSIS — H33103 Unspecified retinoschisis, bilateral: Secondary | ICD-10-CM | POA: Diagnosis not present

## 2021-04-22 ENCOUNTER — Ambulatory Visit
Admission: RE | Admit: 2021-04-22 | Discharge: 2021-04-22 | Disposition: A | Discharge status: Home / Self Care | Payer: Medicare HMO | Source: Ambulatory Visit | Attending: Family Medicine | Admitting: Family Medicine

## 2021-04-22 DIAGNOSIS — Z1231 Encounter for screening mammogram for malignant neoplasm of breast: Secondary | ICD-10-CM | POA: Diagnosis not present

## 2021-06-09 DIAGNOSIS — Z0001 Encounter for general adult medical examination with abnormal findings: Secondary | ICD-10-CM | POA: Diagnosis not present

## 2021-06-09 DIAGNOSIS — L309 Dermatitis, unspecified: Secondary | ICD-10-CM | POA: Diagnosis not present

## 2021-06-09 DIAGNOSIS — E7800 Pure hypercholesterolemia, unspecified: Secondary | ICD-10-CM | POA: Diagnosis not present

## 2021-06-09 DIAGNOSIS — R69 Illness, unspecified: Secondary | ICD-10-CM | POA: Diagnosis not present

## 2021-06-09 DIAGNOSIS — Z79899 Other long term (current) drug therapy: Secondary | ICD-10-CM | POA: Diagnosis not present

## 2021-12-12 DIAGNOSIS — Z6831 Body mass index (BMI) 31.0-31.9, adult: Secondary | ICD-10-CM | POA: Diagnosis not present

## 2021-12-12 DIAGNOSIS — Z01419 Encounter for gynecological examination (general) (routine) without abnormal findings: Secondary | ICD-10-CM | POA: Diagnosis not present

## 2021-12-12 DIAGNOSIS — N952 Postmenopausal atrophic vaginitis: Secondary | ICD-10-CM | POA: Diagnosis not present

## 2022-01-16 DIAGNOSIS — M810 Age-related osteoporosis without current pathological fracture: Secondary | ICD-10-CM | POA: Diagnosis not present

## 2022-02-06 DIAGNOSIS — Z0184 Encounter for antibody response examination: Secondary | ICD-10-CM | POA: Diagnosis not present

## 2022-03-22 DIAGNOSIS — M810 Age-related osteoporosis without current pathological fracture: Secondary | ICD-10-CM | POA: Diagnosis not present

## 2022-07-27 IMAGING — MG MM DIGITAL SCREENING BILAT W/ TOMO AND CAD
8 series · 8 of 24 positions shown · non-contrast
Comparison: Previous exam(s).

CLINICAL DATA: Screening.

EXAM:
DIGITAL SCREENING BILATERAL MAMMOGRAM WITH TOMOSYNTHESIS AND CAD
TECHNIQUE: Bilateral screening digital craniocaudal and mediolateral oblique
mammograms were obtained. Bilateral screening digital breast
tomosynthesis was performed. The images were evaluated with
computer-aided detection.

[R MLO synth-2D]
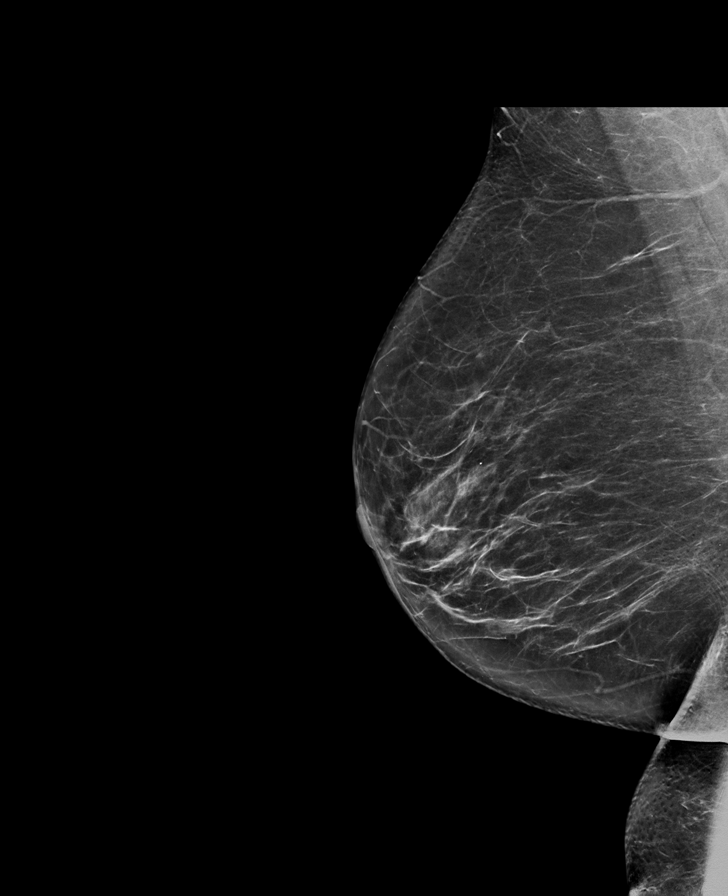

[L MLO synth-2D]
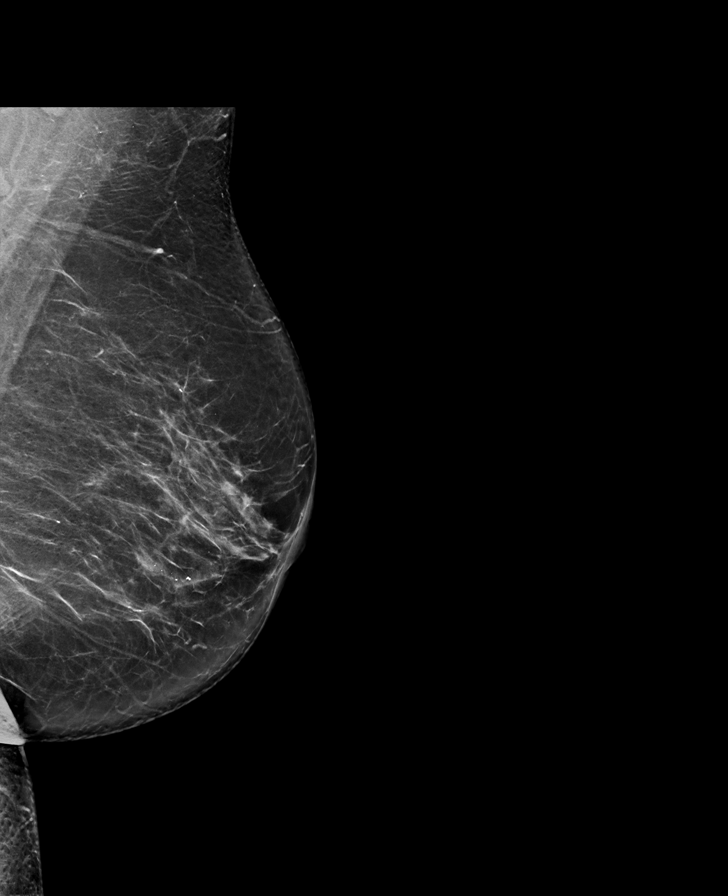

[L CC synth-2D]
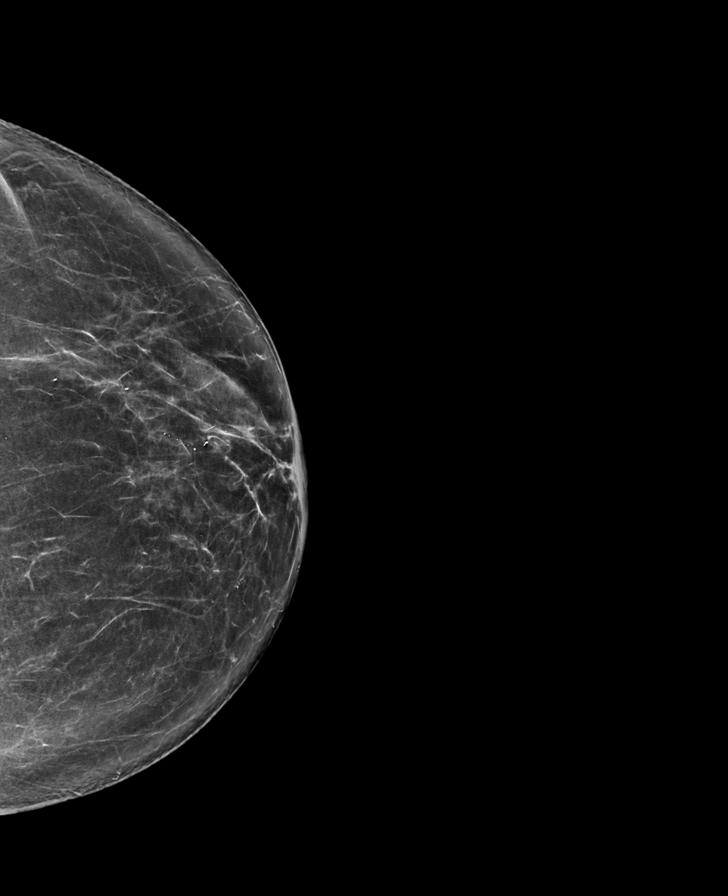

[R CC synth-2D]
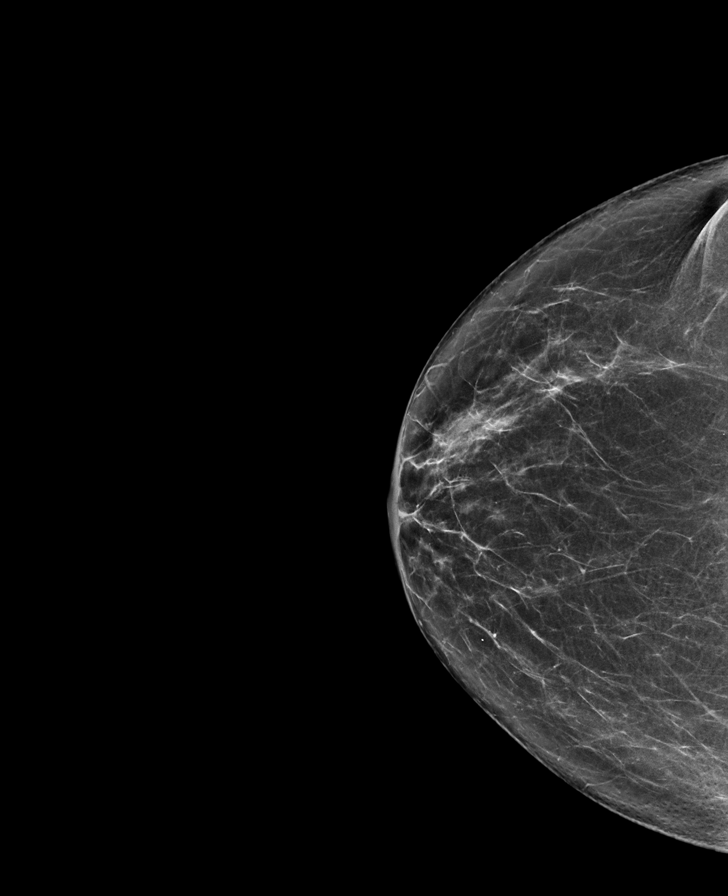

[L CC tomo · tomo slice 37/72.0]
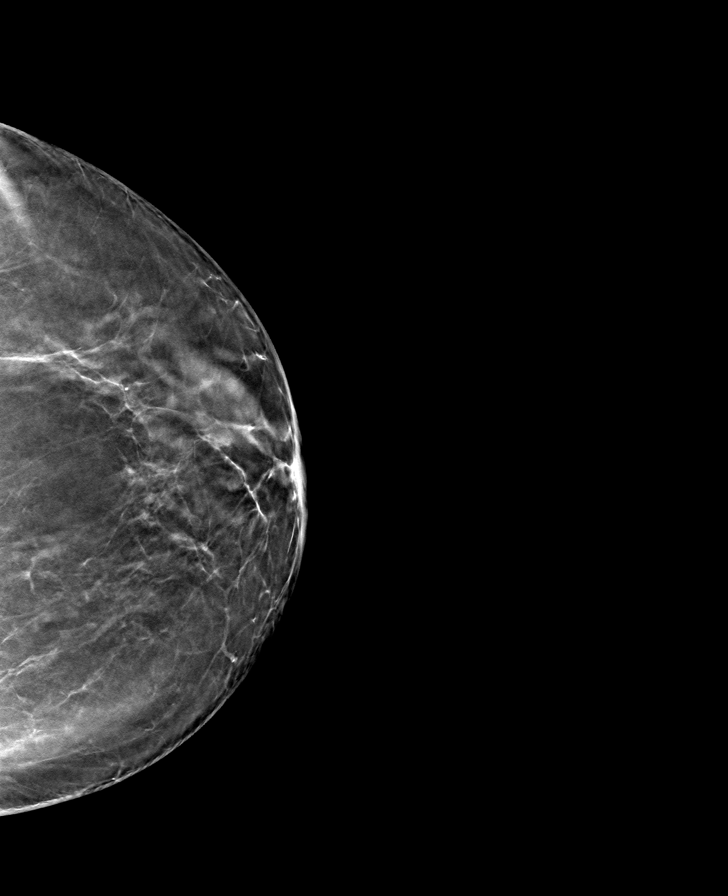

[R MLO tomo · tomo slice 39/77.0]
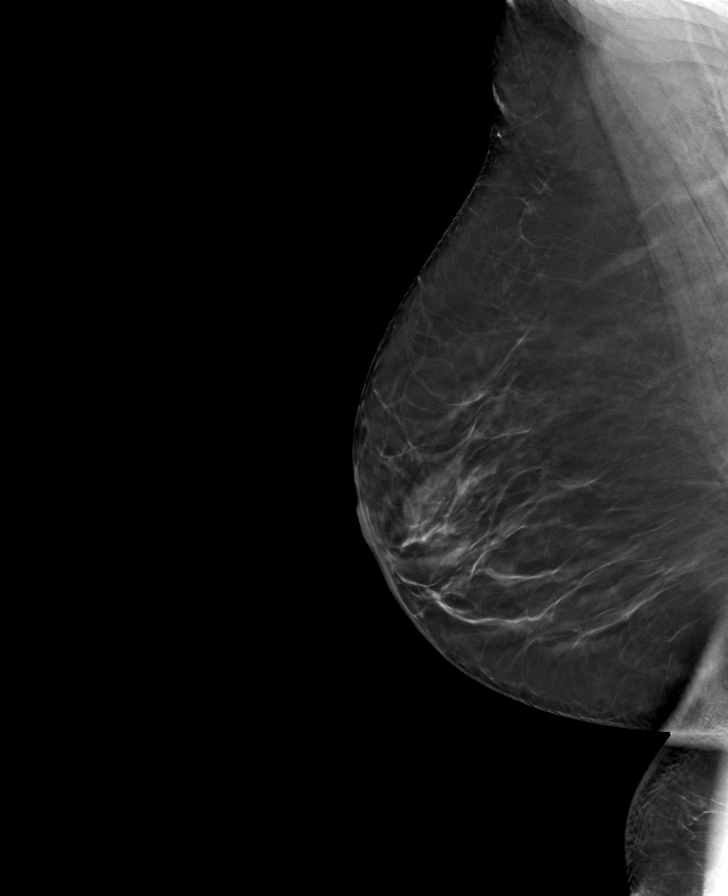

[R CC tomo · tomo slice 37/72.0]
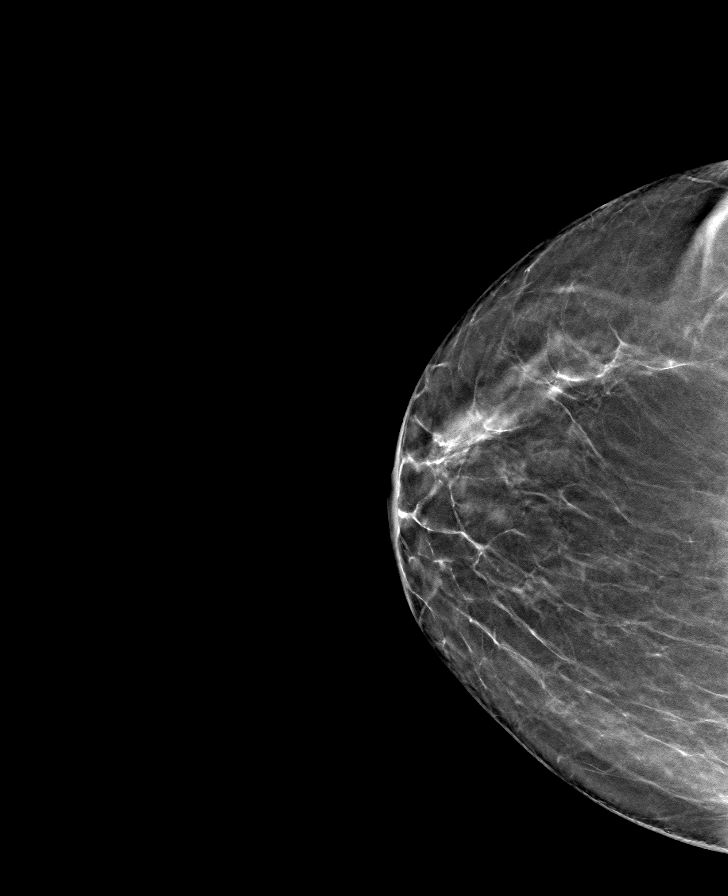

[L MLO tomo · tomo slice 43/84.0]
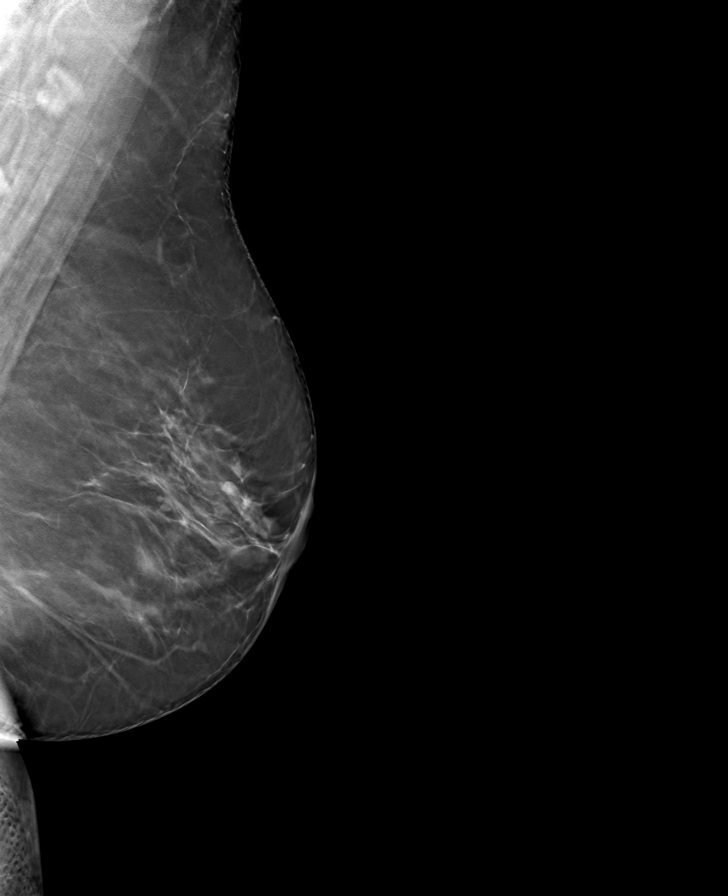

[8 of 24 positions shown; findings below may reference images not displayed]

ACR Breast Density Category b: There are scattered areas of
fibroglandular density.
FINDINGS: There are no findings suspicious for malignancy.
IMPRESSION: No mammographic evidence of malignancy. A result letter of this
screening mammogram will be mailed directly to the patient.

RECOMMENDATION:
Screening mammogram in one year. (Code:51-O-LD2)

BI-RADS CATEGORY  1: Negative.

## 2022-08-15 ENCOUNTER — Other Ambulatory Visit: Payer: Self-pay | Admitting: Family Medicine

## 2022-08-15 DIAGNOSIS — Z0000 Encounter for general adult medical examination without abnormal findings: Secondary | ICD-10-CM

## 2022-08-18 ENCOUNTER — Ambulatory Visit: Payer: Medicare HMO

## 2022-09-01 ENCOUNTER — Ambulatory Visit
Admission: RE | Admit: 2022-09-01 | Discharge: 2022-09-01 | Disposition: A | Discharge status: Home / Self Care | Payer: Medicare HMO | Source: Ambulatory Visit | Attending: Family Medicine | Admitting: Family Medicine

## 2022-09-01 DIAGNOSIS — Z0000 Encounter for general adult medical examination without abnormal findings: Secondary | ICD-10-CM
